# Patient Record
Sex: Female | Born: 1990 | Race: Black or African American | Hispanic: No | Marital: Single | Smoking: Current some day smoker
Health system: Southern US, Community
[De-identification: ages and names within clinical notes are randomized; demographics above are authoritative.]

## PROBLEM LIST (undated history)

## (undated) DIAGNOSIS — I10 Essential (primary) hypertension: Secondary | ICD-10-CM

---

## 2014-02-03 ENCOUNTER — Emergency Department (HOSPITAL_COMMUNITY): Payer: BC Managed Care – PPO

## 2014-02-03 ENCOUNTER — Encounter (HOSPITAL_COMMUNITY): Payer: Self-pay | Admitting: Emergency Medicine

## 2014-02-03 ENCOUNTER — Emergency Department (HOSPITAL_COMMUNITY)
Admission: EM | Admit: 2014-02-03 | Discharge: 2014-02-03 | Disposition: A | Discharge status: Home / Self Care | Payer: BC Managed Care – PPO | Attending: Emergency Medicine | Admitting: Emergency Medicine

## 2014-02-03 DIAGNOSIS — R0781 Pleurodynia: Secondary | ICD-10-CM

## 2014-02-03 DIAGNOSIS — R071 Chest pain on breathing: Secondary | ICD-10-CM | POA: Insufficient documentation

## 2014-02-03 LAB — I-STAT CHEM 8, ED
BUN: 14 mg/dL (ref 6–23)
CHLORIDE: 107 meq/L (ref 96–112)
CREATININE: 0.9 mg/dL (ref 0.50–1.10)
Calcium, Ion: 1.19 mmol/L (ref 1.12–1.23)
Glucose, Bld: 87 mg/dL (ref 70–99)
HCT: 40 % (ref 36.0–46.0)
Hemoglobin: 13.6 g/dL (ref 12.0–15.0)
Potassium: 3.9 mEq/L (ref 3.7–5.3)
SODIUM: 143 meq/L (ref 137–147)
TCO2: 21 mmol/L (ref 0–100)

## 2014-02-03 LAB — D-DIMER, QUANTITATIVE: D-Dimer, Quant: 1.18 ug/mL-FEU — ABNORMAL HIGH (ref 0.00–0.48)

## 2014-02-03 MED ORDER — IOHEXOL 350 MG/ML SOLN
100.0000 mL | Freq: Once | INTRAVENOUS | Status: AC | PRN
  Administered 2014-02-03: 100 mL via INTRAVENOUS

## 2014-02-03 MED ORDER — INDOMETHACIN 25 MG PO CAPS: 25.0000 mg | ORAL_CAPSULE | Freq: Three times a day (TID) | ORAL | Status: DC | PRN

## 2014-02-03 NOTE — Discharge Instructions (Signed)

## 2014-02-03 NOTE — ED Provider Notes (Signed)
CSN: [id number]     Arrival date & time 02/03/14  1430 History   First MD Initiated Contact with Patient 02/03/14 1700     Chief Complaint  Patient presents with  . Chest Pain     (Consider location/radiation/quality/duration/timing/severity/associated sxs/prior Treatment) Patient is a 23 y.o. female presenting with chest pain. The history is provided by the patient. No language interpreter was used.  Chest Pain Pain location:  Substernal area Pain quality: sharp   Pain radiates to:  Upper back Pain radiates to the back: yes   Pain severity:  Moderate Onset quality:  Sudden Duration:  10 hours Timing:  Intermittent Progression:  Unchanged Chronicity:  New Context: breathing   Associated symptoms: no cough, no fever, no lower extremity edema, no palpitations and no shortness of breath   Risk factors: birth control   Patient started on nuvaring for birth control 2 weeks ago.  This morning awoken from sleep with sharp, substernal to right sided upper chest wall pain that is worse with breathing.  Non-smoker.  History reviewed. No pertinent past medical history. No past surgical history on file. No family history on file. History  Substance Use Topics  . Smoking status: Not on file  . Smokeless tobacco: Not on file  . Alcohol Use: Not on file   OB History   Grav Para Term Preterm Abortions TAB SAB Ect Mult Living                 Review of Systems  Constitutional: Negative for fever.  Respiratory: Negative for cough and shortness of breath.   Cardiovascular: Positive for chest pain. Negative for palpitations and leg swelling.  All other systems reviewed and are negative.     Allergies  Review of patient's allergies indicates no known allergies.  Home Medications  No current outpatient prescriptions on file. There were no vitals taken for this visit. Physical Exam  Nursing note and vitals reviewed. Constitutional: She is oriented to person, place, and time. She  appears well-developed and well-nourished. No distress.  HENT:  Head: Normocephalic and atraumatic.  Eyes: Pupils are equal, round, and reactive to light.  Neck: Normal range of motion.  Cardiovascular: Normal rate and regular rhythm.   Pulmonary/Chest: Effort normal and breath sounds normal. She has no wheezes. She exhibits no tenderness.  Abdominal: Soft. Bowel sounds are normal.  Musculoskeletal: She exhibits no edema and no tenderness.  Lymphadenopathy:    She has no cervical adenopathy.  Neurological: She is alert and oriented to person, place, and time.  Skin: Skin is warm and dry.  Psychiatric: She has a normal mood and affect. Her behavior is normal. Judgment and thought content normal.    ED Course  Procedures (including critical care time) Labs Review Labs Reviewed - No data to display Imaging Review Dg Chest 2 View  02/03/2014   CLINICAL DATA:  Chest pain, shortness of Breath  EXAM: CHEST  2 VIEW  COMPARISON:  None.  FINDINGS: Cardiomediastinal silhouette is unremarkable. No acute infiltrate or pleural effusion. No pulmonary edema. Bony thorax is unremarkable.  IMPRESSION: No active cardiopulmonary disease.   Electronically Signed   By: Natasha MeadLiviu  Pop M.D.   On: 02/03/2014 15:31     EKG Interpretation None    Radiology and lab results reviewed, shared with patient. D-dimer elevated, but CTA chest negative for PE. MDM   Final diagnoses:  None    Pleuritic chest pain.    Jimmye Normanavid John Sevanna Ballengee, NP 02/04/14 [phone]

## 2014-02-03 NOTE — ED Notes (Signed)
Pt in c/o central chest pain with shortness of breath that woke her from sleep this am, states pain has been constant since it started and radiates into her back, no distress noted at this time

## 2014-02-04 NOTE — ED Provider Notes (Signed)
Medical screening examination/treatment/procedure(s) were performed by non-physician practitioner and as supervising physician I was immediately available for consultation/collaboration.   EKG Interpretation   Date/Time:  Thursday February 03 2014 14:37:28 EDT Ventricular Rate:  77 PR Interval:  126 QRS Duration: 90 QT Interval:  370 QTC Calculation: 418 R Axis:   90 Text Interpretation:  Normal sinus rhythm with sinus arrhythmia Rightward  axis Nonspecific T wave abnormality Abnormal ECG No previous ECGs  available Confirmed by Manus GunningANCOUR  MD, Shirlena Brinegar [phone]([id number]) on 02/03/2014 5:27:16 PM        Glynn OctaveStephen Denetta Fei, MD 02/04/14 [phone]

## 2014-07-30 ENCOUNTER — Emergency Department (HOSPITAL_COMMUNITY)
Admission: EM | Admit: 2014-07-30 | Discharge: 2014-07-30 | Disposition: A | Discharge status: Home / Self Care | Payer: No Typology Code available for payment source | Attending: Emergency Medicine | Admitting: Emergency Medicine

## 2014-07-30 ENCOUNTER — Emergency Department (HOSPITAL_COMMUNITY): Payer: No Typology Code available for payment source

## 2014-07-30 ENCOUNTER — Encounter (HOSPITAL_COMMUNITY): Payer: Self-pay | Admitting: Emergency Medicine

## 2014-07-30 DIAGNOSIS — S199XXA Unspecified injury of neck, initial encounter: Secondary | ICD-10-CM | POA: Diagnosis present

## 2014-07-30 DIAGNOSIS — S139XXA Sprain of joints and ligaments of unspecified parts of neck, initial encounter: Secondary | ICD-10-CM | POA: Insufficient documentation

## 2014-07-30 DIAGNOSIS — S3982XA Other specified injuries of lower back, initial encounter: Secondary | ICD-10-CM | POA: Diagnosis not present

## 2014-07-30 DIAGNOSIS — Y92410 Unspecified street and highway as the place of occurrence of the external cause: Secondary | ICD-10-CM | POA: Insufficient documentation

## 2014-07-30 DIAGNOSIS — S161XXA Strain of muscle, fascia and tendon at neck level, initial encounter: Secondary | ICD-10-CM

## 2014-07-30 DIAGNOSIS — Y9389 Activity, other specified: Secondary | ICD-10-CM | POA: Insufficient documentation

## 2014-07-30 MED ORDER — HYDROCODONE-ACETAMINOPHEN 5-325 MG PO TABS
1.0000 | ORAL_TABLET | Freq: Once | ORAL | Status: AC
  Administered 2014-07-30: 1 via ORAL
  Filled 2014-07-30: qty 1

## 2014-07-30 MED ORDER — CYCLOBENZAPRINE HCL 10 MG PO TABS
10.0000 mg | ORAL_TABLET | Freq: Two times a day (BID) | ORAL | Status: DC | PRN
Start: 2014-07-30 — End: 2014-09-30

## 2014-07-30 NOTE — Discharge Instructions (Signed)
Please follow the directions provided. Be sure to follow-up with your primary care doctor. I have provided resources in case you do not have one so you can establish care.  You may take ibuprofen 400 mg by mouth every 6 hours for pain and you may take your muscle relaxant as directed also.  Feel free to return for any worsening or concerning symptoms.  SEEK IMMEDIATE MEDICAL CARE IF:  You develop any bleeding.  You develop stomach upset.  You have signs of an allergic reaction to your medicine.  Your symptoms get worse.  You develop new, unexplained symptoms.  You have numbness, tingling, weakness, or paralysis in any part of your body.    Emergency Department Resource Guide 1) Find a Doctor and Pay Out of Pocket Although you won't have to find out who is covered by your insurance plan, it is a good idea to ask around and get recommendations. You will then need to call the office and see if the doctor you have chosen will accept you as a new patient and what types of options they offer for patients who are self-pay. Some doctors offer discounts or will set up payment plans for their patients who do not have insurance, but you will need to ask so you aren't surprised when you get to your appointment.  2) Contact Your Local Health Department Not all health departments have doctors that can see patients for sick visits, but many do, so it is worth a call to see if yours does. If you don't know where your local health department is, you can check in your phone book. The CDC also has a tool to help you locate your state's health department, and many state websites also have listings of all of their local health departments.  3) Find a Walk-in Clinic If your illness is not likely to be very severe or complicated, you may want to try a walk in clinic. These are popping up all over the country in pharmacies, drugstores, and shopping centers. They're usually staffed by nurse practitioners or physician  assistants that have been trained to treat common illnesses and complaints. They're usually fairly quick and inexpensive. However, if you have serious medical issues or chronic medical problems, these are probably not your best option.  No Primary Care Doctor: - Call Health Connect at  [phone] - they can help you locate a primary care doctor that  accepts your insurance, provides certain services, etc. - Physician Referral Service- [phone]  Chronic Pain Problems: Organization         Address  Phone   Notes  [facility]  [phone] Patients need to be referred by their primary care doctor.   Medication Assistance: Organization         Address  Phone   Notes  [facility] [address] [place] [place], [place] [address] [phone] --Must be a resident of [facility] -- Must have NO insurance coverage whatsoever (no Medicaid/ Medicare, etc.) -- The pt. MUST have a primary care doctor that directs their care regularly and follows them in the community   [facility]ist  [phone]   [employer]  [phone]    Agencies that provide inexpensive medical care: Organization         Address  Phone   Notes  [facility]  [phone]   [facility]    [phone]   [facility] [address] [place], [place] [address] [phone]) [phone]   [facility] [place] [address], [place] [phone](336) [phone]   [facility]anned Parenthood    [phone]) [phone]   [facility]    [phone]   [facility] Health and [facility]  [address], [place] Phone:  [phone](336) [phone], Fax:  [phone] Hours of Operation:  9 am - 6 pm, M-F.  Also accepts Medicaid/Medicare and self-pay.  [facility]  [address], [place] Phone: [phone](336) [phone], Fax: [phone](336) [phone]. Hours of Operation:  8:30 am - 5:30 pm, M-F.  Also accepts  Medicaid and self-pay.  [facility] [place] [address], [place] Phone: [phone](336) [phone]   [facility] [address], [place], [place] [phone], Ext. [phone] Mondays & Thursdays: 7-9 AM.  First 15 patients are seen on a first come, first serve basis.    Medicaid-accepting [facility] Providers:  Organization         Address  Phone   Notes  [facility] [address], [place] [phone](336) [phone] Also accepts self-pay patients.  [facility] [address], [place]  [phone]) [phone]   [facility] [address], [place] [phone]) [phone]   [facility] [address] [place] [address], [place] [phone]) [phone]   Renaye RakersVeita Bland [address], [place]   [phone](336) [phone] Only accepts [place] Access [place] patients after they have their name applied to their card.   Self-Pay (no insurance) in [facility]:  Organization         Address  Phone   Notes  Sickle Cell Patients, [facility] [address], [place] [phone](336) [phone]   [facility] [address], [place] [phone]) [phone]   [facility]  [address], [place] [phone](336) [phone]   [facility]/Dr. Osei-Bonsu  [address] [place] [address], [place] or [id number], [place] [phone](336) [phone] Phone number for both [place] and [place] locations is the same.  [facility] [address], [place] [phone]) [phone]   [facility]  [address] [place] [address], [place] or [address] [phone]) [phone]   [facility] [address], [place] [phone](336) [phone], phone; [phone](336) [phone], fax Sees patients 1st and 3rd Saturday of every month.  Must not qualify for public or private insurance (i.e. Medicaid, Medicare, [place] Health Choice, Veterans' Benefits)  Household  income should be no more than 200% of the poverty level The clinic cannot treat you if you are pregnant or think you are pregnant  Sexually transmitted diseases are not treated at the clinic.    Dental Care: Organization         Address  Phone  Notes  [facility] Department of [facility] [address], [place] [phone]) [phone] Accepts children up to age 22 who are enrolled in [place]icaid or [place] Health Choice; pregnant women with a Medicaid card; and children who have applied for Medicaid or [place] Health Choice, but were declined, whose parents can pay a reduced fee at time of service.  [facility] of [facility] [place]  [address], [place] [phone]) [phone] Accepts children up to age 22 who are enrolled in  Medicaid or [place] Health Choice; pregnant women with a Medicaid card; and children who have applied for Medicaid or [place] Health Choice, but were declined, whose parents can pay a reduced fee at time of service.  [facility]  [address], [place] [phone]) [phone] Patients are seen by appointment only. Walk-ins are not accepted. [facility] will see patients 22 years of age and older. Monday - Tuesday (8am-5pm) Most Wednesdays (8:30-5pm) $30 per visit, cash only  [facility]  [address], [facility] [phone] Patients are seen by appointment only. Walk-ins are not accepted. [facility] will see patients 22 years of age and older. One Wednesday Evening (Monthly: Volunteer Based).  $30 per visit, cash only  [facility]  [phone] for adults; Children under age 734, call Graduate Pediatric Dentistry at [phone]. Children aged 884-14, please call [phone] to request a pediatric application.  Dental services are provided in all areas of dental care including fillings, crowns and bridges, complete and partial dentures, implants, gum  treatment, root canals, and extractions. Preventive care is also provided. Treatment is provided to both adults and children. Patients are selected via a lottery and there is often a waiting list.   [facility] [address], [place]  [phone]) [phone] [email or web address]   [facility] [address], [place], [place] [phone], Ext. [phone] Second and Fourth Thursday of each month, opens at 6:30 AM; Clinic ends at 9 AM.  Patients are seen on a first-come first-served basis, and a limited number are seen during each clinic.   [facility]  [address], [place], [place] [phone]) [phone]   Eligibility Requirements You must have lived in [place], [place], or [place] counties for at least the last three months.   You cannot be eligible for state or federal sponsored National Cityhealthcare insurance, including CIGNAVeterans Administration, [place]icaid, or [employer] EntertainmentMedicare.   You generally cannot be eligible for healthcare insurance through your employer.    How to apply: Eligibility screenings are held every Tuesday and Wednesday afternoon from 1:00 pm until 4:00 pm. You do not need an appointment for the interview!  [facility] [address], [place], [place] [phone]   [facility] Health Department  [phone]   [facility]  [phone]   [facility]  [phone]    Behavioral Health Resources in the Community: Intensive Outpatient Programs Organization         Address  Phone  Notes  [facility] [address], [place], [place] [phone]   [facility] [address], [place], [place] [phone]   [facility]: [facility] Drug S[facility] [address], [place], [place]  [phone]   [facility] [address],  [place], [place] [phone] or [phone]   Substance Abuse Resources Organization         Address  Phone  Notes  Alcohol and  Drug Services  [phone]   [facility]  [phone]   [facility]  [phone]   [facility]  [phone]   Residential & Outpatient Substance Abuse Program  [phone]   Psychological Services Organization         Address  Phone  Notes  [facility]  [phone]- [phone]   [facility]  [phone]   [facility] [address], [place] [phone] or  [phone]    Mobile Crisis Teams Organization         Address  Phone  Notes  [facility], Mobile Crisis Care Unit  [phone]   [facility]  [address] [place], [place] [phone]   [facility] [address] [place] [phone]    Self-Help/Support Groups Organization         Address  Phone             Notes  Mental Health Assoc. of  - variety of support groups  336- [phone] Call for more information  Narcotics Anonymous (NA), [facility]aring [facility] [address], [place]  2 meetings at this location   Statisticianesidential Treatment Programs Organization         Address  Phone  Notes  [facility] [address],    [place]  [phone]   [facility]  [address], [place] [phone], [place], [place] [phone]   [facility] [address], [place] [phone] Admissions: 8am-3pm M-F  [facility] [address]    [place], [place] [phone]   [facility] [address], [place], [place] [phone]   [facility] [address]  [place], [place] [phone]   [facility] - Intensive Outpatient [address], [place], [place] [phone]   [facility] ([facility] [address]  [place], [place] [phone] or [phone]   [facility] ([facility]) [address] [place], [place] [phone] Accepts Medicaid  Fellowship  [facility] [address]  [place] [phone] Substance Abuse/Addiction Treatment   [facility] Behavioral Health Resources Organization         Address  Phone  Notes  [facility]  [phone]) [phone]   Angie FavaJulie Brannon, PhD [address], [address] [place], [place]   [phone](336) [phone] or [phone]   [facility]   [address] [place], [place] [phone](336) [phone]   [facility] [address], [place], [place] [phone](336) [phone] Insurance/Medicaid/sponsorship through [facility]  [facility] Families [address]                                    [place], [place] [phone]) [phone] Therapy/tele-psych/case  [facility] [address]   [place], [place] [phone](336) [phone]    Dr. Lolly MustacheArfeen  [phone](336) [phone]   [facility] [place]  [employer] [facility] 1) [address], [place] 2) [address], [place] 3)  [address], [place] [phone](336) [phone]([phone]  [phone](336) [phone]   [facility] Child Abuse Hotline [phone](336) [phone] or [phone](336) [phone] (After Hours)
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
## 2014-08-04 NOTE — ED Provider Notes (Signed)
Medical screening examination/treatment/procedure(s) were performed by non-physician practitioner and as supervising physician I was immediately available for consultation/collaboration.   EKG Interpretation None        Vanetta MuldersScott Khaza Blansett, MD 08/04/14 2102

## 2014-09-12 ENCOUNTER — Other Ambulatory Visit: Payer: Self-pay | Admitting: Obstetrics & Gynecology

## 2014-09-12 ENCOUNTER — Other Ambulatory Visit (HOSPITAL_COMMUNITY)
Admission: RE | Admit: 2014-09-12 | Discharge: 2014-09-12 | Disposition: A | Discharge status: Home / Self Care | Payer: BC Managed Care – PPO | Source: Ambulatory Visit | Attending: Obstetrics & Gynecology | Admitting: Obstetrics & Gynecology

## 2014-09-12 DIAGNOSIS — Z01419 Encounter for gynecological examination (general) (routine) without abnormal findings: Secondary | ICD-10-CM | POA: Insufficient documentation

## 2014-09-12 DIAGNOSIS — Z113 Encounter for screening for infections with a predominantly sexual mode of transmission: Secondary | ICD-10-CM | POA: Insufficient documentation

## 2014-09-14 LAB — CYTOLOGY - PAP

## 2014-09-29 ENCOUNTER — Encounter (HOSPITAL_COMMUNITY): Payer: Self-pay | Admitting: *Deleted

## 2014-09-29 ENCOUNTER — Emergency Department (HOSPITAL_COMMUNITY)
Admission: EM | Admit: 2014-09-29 | Discharge: 2014-09-30 | Disposition: A | Discharge status: Home / Self Care | Payer: BC Managed Care – PPO | Source: Home / Self Care | Attending: Emergency Medicine | Admitting: Emergency Medicine

## 2014-09-29 ENCOUNTER — Telehealth: Payer: No Typology Code available for payment source | Admitting: Physician Assistant

## 2014-09-29 DIAGNOSIS — M545 Low back pain: Secondary | ICD-10-CM

## 2014-09-29 DIAGNOSIS — M5442 Lumbago with sciatica, left side: Secondary | ICD-10-CM

## 2014-09-29 DIAGNOSIS — Z720 Tobacco use: Secondary | ICD-10-CM | POA: Diagnosis not present

## 2014-09-29 DIAGNOSIS — Z79899 Other long term (current) drug therapy: Secondary | ICD-10-CM

## 2014-09-29 DIAGNOSIS — Z3A01 Less than 8 weeks gestation of pregnancy: Secondary | ICD-10-CM | POA: Diagnosis not present

## 2014-09-29 DIAGNOSIS — Z3A08 8 weeks gestation of pregnancy: Secondary | ICD-10-CM

## 2014-09-29 DIAGNOSIS — N390 Urinary tract infection, site not specified: Secondary | ICD-10-CM

## 2014-09-29 DIAGNOSIS — O2341 Unspecified infection of urinary tract in pregnancy, first trimester: Secondary | ICD-10-CM

## 2014-09-29 DIAGNOSIS — O200 Threatened abortion: Secondary | ICD-10-CM | POA: Insufficient documentation

## 2014-09-29 DIAGNOSIS — O209 Hemorrhage in early pregnancy, unspecified: Secondary | ICD-10-CM | POA: Diagnosis present

## 2014-09-29 DIAGNOSIS — Z791 Long term (current) use of non-steroidal anti-inflammatories (NSAID): Secondary | ICD-10-CM

## 2014-09-29 DIAGNOSIS — O9989 Other specified diseases and conditions complicating pregnancy, childbirth and the puerperium: Secondary | ICD-10-CM | POA: Insufficient documentation

## 2014-09-29 NOTE — ED Notes (Signed)
Pt c/o intermittent mid low back pain since 9 am this morning. Pt states she is [redacted] weeks pregnant and wants to make sure everything is ok. Pt denies bleeding, discharge. No recent falls or trauma
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
## 2014-09-30 ENCOUNTER — Emergency Department (HOSPITAL_COMMUNITY): Payer: BC Managed Care – PPO

## 2014-09-30 ENCOUNTER — Encounter (HOSPITAL_COMMUNITY): Payer: Self-pay | Admitting: *Deleted

## 2014-09-30 ENCOUNTER — Emergency Department (HOSPITAL_COMMUNITY)
Admission: EM | Admit: 2014-09-30 | Discharge: 2014-10-01 | Disposition: A | Discharge status: Home / Self Care | Payer: BC Managed Care – PPO | Attending: Emergency Medicine | Admitting: Emergency Medicine

## 2014-09-30 DIAGNOSIS — O200 Threatened abortion: Secondary | ICD-10-CM

## 2014-09-30 DIAGNOSIS — N939 Abnormal uterine and vaginal bleeding, unspecified: Secondary | ICD-10-CM

## 2014-09-30 LAB — POC URINE PREG, ED: PREG TEST UR: POSITIVE — AB

## 2014-09-30 LAB — CBC
HCT: 39.1 % (ref 36.0–46.0)
HEMOGLOBIN: 13.2 g/dL (ref 12.0–15.0)
MCH: 29.3 pg (ref 26.0–34.0)
MCHC: 33.8 g/dL (ref 30.0–36.0)
MCV: 86.9 fL (ref 78.0–100.0)
Platelets: 307 10*3/uL (ref 150–400)
RBC: 4.5 MIL/uL (ref 3.87–5.11)
RDW: 13.9 % (ref 11.5–15.5)
WBC: 11 10*3/uL — AB (ref 4.0–10.5)

## 2014-09-30 LAB — URINALYSIS, ROUTINE W REFLEX MICROSCOPIC
Bilirubin Urine: NEGATIVE
GLUCOSE, UA: NEGATIVE mg/dL
Hgb urine dipstick: NEGATIVE
KETONES UR: 15 mg/dL — AB
Nitrite: POSITIVE — AB
PH: 6 (ref 5.0–8.0)
Protein, ur: NEGATIVE mg/dL
SPECIFIC GRAVITY, URINE: 1.031 — AB (ref 1.005–1.030)
Urobilinogen, UA: 1 mg/dL (ref 0.0–1.0)

## 2014-09-30 LAB — ABO/RH: ABO/RH(D): O POS

## 2014-09-30 LAB — URINE MICROSCOPIC-ADD ON

## 2014-09-30 LAB — I-STAT CHEM 8, ED
BUN: 4 mg/dL — AB (ref 6–23)
CREATININE: 0.7 mg/dL (ref 0.50–1.10)
Calcium, Ion: 1.19 mmol/L (ref 1.12–1.23)
Chloride: 102 mEq/L (ref 96–112)
Glucose, Bld: 91 mg/dL (ref 70–99)
HCT: 42 % (ref 36.0–46.0)
Hemoglobin: 14.3 g/dL (ref 12.0–15.0)
POTASSIUM: 3.4 meq/L — AB (ref 3.7–5.3)
SODIUM: 138 meq/L (ref 137–147)
TCO2: 21 mmol/L (ref 0–100)

## 2014-09-30 LAB — HCG, QUANTITATIVE, PREGNANCY: HCG, BETA CHAIN, QUANT, S: 36051 m[IU]/mL — AB (ref <5)

## 2014-09-30 MED ORDER — CYCLOBENZAPRINE HCL 10 MG PO TABS: 10.0000 mg | ORAL_TABLET | Freq: Three times a day (TID) | ORAL | Status: DC | PRN

## 2014-09-30 MED ORDER — CEPHALEXIN 500 MG PO CAPS: 500.0000 mg | ORAL_CAPSULE | Freq: Three times a day (TID) | ORAL | Status: DC

## 2014-09-30 MED ORDER — OXYCODONE-ACETAMINOPHEN 5-325 MG PO TABS
1.0000 | ORAL_TABLET | Freq: Once | ORAL | Status: AC
  Administered 2014-09-30: 1 via ORAL

## 2014-09-30 MED ORDER — CEPHALEXIN 250 MG PO CAPS
500.0000 mg | ORAL_CAPSULE | Freq: Once | ORAL | Status: AC
  Administered 2014-09-30: 500 mg via ORAL

## 2014-09-30 NOTE — Discharge Instructions (Signed)
Take Tylenol for pain as directed on the bottle. Take Keflex as prescribed until all gone for the infection. Try some heating pads, stretches. Please follow-up with your primary care doctor or OB/GYN to recheck urine in 1 week to make sure infection has resolved and for further evaluation of the back pain if it persists. Return if symptoms are worsening  Acute Urinary Retention Acute urinary retention is the temporary inability to urinate. This is an uncommon problem in women. It can be caused by:  Infection.  A side effect of a medicine.  A problem in a nearby organ that presses or squeezes on the bladder or the urethra (the tube that drains the bladder).  Psychological problems.   Surgery on your bladder, urethra, or pelvic organs that causes obstruction to the outflow of urine from your bladder. HOME CARE INSTRUCTIONS  If you are sent home with a Foley catheter and a drainage system, you will need to discuss the best course of action with your health care provider. While the catheter is in, maintain a good intake of fluids. Keep the drainage bag emptied and lower than your catheter. This is so that contaminated urine will not flow back into your bladder, which could lead to a urinary tract infection. There are two main types of drainage bags. One is a large bag that usually is used at night. It has a good capacity that will allow you to sleep through the night without having to empty it. The second type is called a leg bag. It has a smaller capacity so it needs to be emptied more frequently. However, the main advantage is that it can be attached by a leg strap and goes underneath your clothing, allowing you the freedom to move about or leave your home. Only take over-the-counter or prescription medicines for pain, discomfort, or fever as directed by your health care provider.  SEEK MEDICAL CARE IF:  You develop a low-grade fever.  You experience spasms or leakage of urine with the  spasms. SEEK IMMEDIATE MEDICAL CARE IF:   You develop chills or fever.  Your catheter stops draining urine.  Your catheter falls out.  You start to develop increased bleeding that does not respond to rest and increased fluid intake. MAKE SURE YOU:  Understand these instructions.  Will watch your condition.  Will get help right away if you are not doing well or get worse. Document Released: 10/13/2006 Document Revised: 08/04/2013 Document Reviewed: 03/25/2013 [facility] Patient Information 2015 [employer], [place]. This information is not intended to replace advice given to you by your health care provider. Make sure you discuss any questions you have with your health care provider.

## 2014-09-30 NOTE — ED Notes (Signed)
Pt reports being seen here last night for lower back pains, pt is approx [redacted] weeks pregnant. Reports onset today of vaginal bleeding/spotting with mild cramps. No acute distress noted at triage.

## 2014-09-30 NOTE — ED Notes (Signed)
Rec'd in room 24, family accompanying.  Pt reports she is [redacted] weeks pregnant experiencing vaginal bleeding.  Dr Hyacinth MeekerMiller into room with portable US machine.  Able to see area in uterus where embryo should be.

## 2014-09-30 NOTE — ED Notes (Signed)
Pt to US with EDT Foye ClockKristina accompanying.

## 2014-09-30 NOTE — Progress Notes (Signed)
We are sorry that you are not feeling well.  Here is how we plan to help!  Based on what you have shared with me it looks like you mostly have acute back pain.  Acute back pain is defined as musculoskeletal pain that can resolve in 1-3 weeks with conservative treatment.  I have prescribedFlexeril 10 mg every eight hours as needed which is a muscle relaxer.  AS you are pregnant, prescription pain medications can be dangerous to the baby.  The flexeril is safe. Also consider Tylenol for the pain itself as this is also safe for baby. No heavy lifting.  Please keep in mind that muscle relaxer's can cause fatigue and should not be taken while at work or driving.  Back pain is very common.  The pain often gets better over time.  The cause of back pain is usually not dangerous.  Most people can learn to manage their back pain on their own.  Home Care  Stay active.  Start with short walks on flat ground if you can.  Try to walk farther each day.  Do not sit, drive or stand in one place for more than 30 minutes.  Do not stay in bed.  Do not avoid exercise or work.  Activity can help your back heal faster.  Be careful when you bend or lift an object.  Bend at your knees, keep the object close to you, and do not twist.  Sleep on a firm mattress.  Lie on your side, and bend your knees.  If you lie on your back, put a pillow under your knees.  Only take medicines as told by your doctor.  Put ice on the injured area.  Put ice in a plastic bag  Place a towel between your skin and the bag  Leave the ice on for 15-20 minutes, 3-4 times a day for the first 2-3 days.  After that, you can switch between ice and heat packs.  Ask your doctor about back exercises or massage.  Avoid feeling anxious or stressed.  Find good ways to deal with stress, such as exercise.  Get Help Right Way If:  Your pain does not go away with rest or medicine.  Your pain does not go away in 1 week.  You have new  problems.  You do not feel well.  The pain spreads into your legs.  You cannot control when you poop (bowel movement) or pee (urinate)  You feel sick to your stomach (nauseous) or throw up (vomit)  You have belly (abdominal) pain.  You feel like you may pass out (faint).  If you develop a fever.  Make Sure you:  Understand these instructions.  Will watch your condition  Will get help right away if you are not doing well or get worse.  Your e-visit answers were reviewed by a board certified advanced clinical practitioner to complete your personal care plan.  Depending on the condition, your plan could have included both over the counter or prescription medications.  Please review your pharmacy choice.  If there is a problem, you may call our nursing hot line at [phone] and have the prescription routed to another pharmacy.  Your safety is important to us.  If you have drug allergies check your prescription carefully.    You can use MyChart to ask questions about today's visit, request a non-urgent call back, or ask for a work or school excuse.  You will get an e-mail in the next two  days asking about your experience.  I hope that your e-visit has been valuable and will speed your recovery. Thank you for using e-visits.

## 2014-09-30 NOTE — Discharge Instructions (Signed)
Tylenol only for pain - your Ultrasound shows that you have a pregnancy at 6 weeks and 3 days - it is in the correct location - you should follow up very closely with your OBGYN if you continue to have bleeding or increased pain.  Please read the attached instructions.

## 2014-09-30 NOTE — ED Provider Notes (Signed)
CSN: [id number]     Arrival date & time 09/30/14  1823 History   First MD Initiated Contact with Patient 09/30/14 2042     Chief Complaint  Patient presents with  . Vaginal Bleeding     (Consider location/radiation/quality/duration/timing/severity/associated sxs/prior Treatment) HPI Comments: The patient is a 23 year old female, she is G1 P0 who believes that she is between 6 and [redacted] weeks pregnant based on last menstrual period. She has been seen at her doctor's office to have a urine pregnancy confirmed. Yesterday which she was seen here with lower back pain and had a urinary tract infection for which Keflex was started. Earlier today the patient started to have bleeding per vagina, she had a large amount of bleeding, this has gradually tapered off and now it is just a light amount of bleeding. She has no abdominal pain, no nausea vomiting fevers or chills. She denies any vaginal discharge. She had her first pelvic exam a couple of weeks ago when she was seen for her pregnancy.  Patient is a 23 y.o. female presenting with vaginal bleeding. The history is provided by the patient.  Vaginal Bleeding   History reviewed. No pertinent past medical history. History reviewed. No pertinent past surgical history. History reviewed. No pertinent family history. History  Substance Use Topics  . Smoking status: Never Smoker   . Smokeless tobacco: Not on file  . Alcohol Use: Yes   OB History    Gravida Para Term Preterm AB TAB SAB Ectopic Multiple Living   1              Review of Systems  Genitourinary: Positive for vaginal bleeding.  All other systems reviewed and are negative.     Allergies  Review of patient's allergies indicates no known allergies.  Home Medications   Prior to Admission medications   Medication Sig Start Date End Date Taking? Authorizing Provider  cephALEXin (KEFLEX) 500 MG capsule Take 1 capsule (500 mg total) by mouth 3 (three) times daily. 09/30/14  Yes Tatyana A  Kirichenko, PA-C  cyclobenzaprine (FLEXERIL) 10 MG tablet Take 1 tablet (10 mg total) by mouth 3 (three) times daily as needed for muscle spasms. Patient not taking: Reported on 09/30/2014 09/30/14   Waldon MerlWilliam C Martin, PA-C   BP 137/83 mmHg  Pulse 71  Temp(Src) 98.1 F (36.7 C) (Oral)  Resp 18  SpO2 100%  LMP 07/06/2014 Physical Exam  Constitutional: She appears well-developed and well-nourished. No distress.  HENT:  Head: Normocephalic and atraumatic.  Mouth/Throat: Oropharynx is clear and moist. No oropharyngeal exudate.  Eyes: Conjunctivae and EOM are normal. Pupils are equal, round, and reactive to light. Right eye exhibits no discharge. Left eye exhibits no discharge. No scleral icterus.  Neck: Normal range of motion. Neck supple. No JVD present. No thyromegaly present.  Cardiovascular: Normal rate, regular rhythm, normal heart sounds and intact distal pulses.  Exam reveals no gallop and no friction rub.   No murmur heard. Pulmonary/Chest: Effort normal and breath sounds normal. No respiratory distress. She has no wheezes. She has no rales.  Abdominal: Soft. Bowel sounds are normal. She exhibits no distension and no mass. There is no tenderness.  Genitourinary:  Chaperone present for vaginal exam, normal appearing external genitalia, moderate amount of dark blood in the vaginal vault, no products of conception, cervical os is closed.  Musculoskeletal: Normal range of motion. She exhibits no edema or tenderness.  Lymphadenopathy:    She has no cervical adenopathy.  Neurological: She is  alert. Coordination normal.  Skin: Skin is warm and dry. No rash noted. No erythema.  Psychiatric: She has a normal mood and affect. Her behavior is normal.  Nursing note and vitals reviewed.   ED Course  Procedures (including critical care time) Labs Review Labs Reviewed  HCG, QUANTITATIVE, PREGNANCY - Abnormal; Notable for the following:    hCG, Beta Chain, Quant, S [id number] (*)    All other  components within normal limits  CBC - Abnormal; Notable for the following:    WBC 11.0 (*)    All other components within normal limits  I-STAT CHEM 8, ED - Abnormal; Notable for the following:    Potassium 3.4 (*)    BUN 4 (*)    All other components within normal limits  ABO/RH    Imaging Review [place] Ob Comp Less 14 Wks  09/30/2014   CLINICAL DATA:  Pregnant, vaginal bleeding  EXAM: OBSTETRIC <14 WK US AND TRANSVAGINAL OB US  TECHNIQUE: Both transabdominal and transvaginal ultrasound examinations were performed for complete evaluation of the gestation as well as the maternal uterus, adnexal regions, and pelvic cul-de-sac. Transvaginal technique was performed to assess early pregnancy.  COMPARISON:  None.  FINDINGS: Intrauterine gestational sac: Visualized/normal in shape.  Yolk sac:  Present  Embryo:  Present  Cardiac Activity: Present  Heart Rate:  131 bpm  CRL:   5.8  mm   6 w 3 d                  US EDC: 05/23/2015  Maternal uterus/adnexae: Small subchorionic hemorrhage.  Bilateral ovaries are not discretely visualized.  No free fluid.  IMPRESSION: Single live intrauterine gestation with estimated gestational age [redacted] weeks 3 days by crown-rump length.   Electronically Signed   By: Charline BillsSriyesh  Krishnan M.D.   On: 09/30/2014 22:32   [place] Ob Transvaginal  09/30/2014   CLINICAL DATA:  Pregnant, vaginal bleeding  EXAM: OBSTETRIC <14 WK US AND TRANSVAGINAL OB US  TECHNIQUE: Both transabdominal and transvaginal ultrasound examinations were performed for complete evaluation of the gestation as well as the maternal uterus, adnexal regions, and pelvic cul-de-sac. Transvaginal technique was performed to assess early pregnancy.  COMPARISON:  None.  FINDINGS: Intrauterine gestational sac: Visualized/normal in shape.  Yolk sac:  Present  Embryo:  Present  Cardiac Activity: Present  Heart Rate:  131 bpm  CRL:   5.8  mm   6 w 3 d                  US EDC: 05/23/2015  Maternal uterus/adnexae: Small subchorionic  hemorrhage.  Bilateral ovaries are not discretely visualized.  No free fluid.  IMPRESSION: Single live intrauterine gestation with estimated gestational age [redacted] weeks 3 days by crown-rump length.   Electronically Signed   By: Charline BillsSriyesh  Krishnan M.D.   On: 09/30/2014 22:32      MDM   Final diagnoses:  Vaginal bleeding  Threatened miscarriage    The patient had no tenderness on internal exam, she has a moderate amount of bleeding, this is likely a threatened abortion, will obtain official ultrasound as my bedside transabdominal ultrasound shows a pregnancy but I am not confident that is intrauterine. Vital signs normal. Check Quant.  US shows [id number] preg, IUP - RH+, pt stable for d/c, explained results, no other emergency conditions exist at this time.   Vida RollerBrian D Amberlie Gaillard, MD 09/30/14 [phone]

## 2014-10-02 LAB — URINE CULTURE: Colony Count: >=100000

## 2014-10-04 ENCOUNTER — Telehealth (HOSPITAL_COMMUNITY): Payer: Self-pay

## 2014-10-04 NOTE — Telephone Encounter (Signed)
Post ED Visit - Positive Culture Follow-up  Culture report reviewed by antimicrobial stewardship pharmacist: []  Wes Dulaney, Pharm.D., BCPS []  Celedonio MiyamotoJeremy Frens, Pharm.D., BCPS []  Georgina PillionElizabeth Martin, [address] BCPS []  BriarcliffMinh Pham, [address]Ph[address]D., BCPS, AAHIVP []  Estella HuskMichelle Turner, Pharm.D., BCPS, AAHIVP []  Babs BertinHaley Baird, Pharm.D.  Positive Urine culture, >/= 100,000 colonies -> E Coli Treated with Cephalexin, organism sensitive to the same and no further patient follow-up is required at this time.   Jean RightClark, Jean Hogan 10/04/2014, 5:26 AM

## 2014-12-18 ENCOUNTER — Emergency Department (HOSPITAL_COMMUNITY)
Admission: EM | Admit: 2014-12-18 | Discharge: 2014-12-18 | Disposition: A | Discharge status: Home / Self Care | Payer: BLUE CROSS/BLUE SHIELD | Attending: Emergency Medicine | Admitting: Emergency Medicine

## 2014-12-18 ENCOUNTER — Encounter (HOSPITAL_COMMUNITY): Payer: Self-pay | Admitting: Emergency Medicine

## 2014-12-18 DIAGNOSIS — E860 Dehydration: Secondary | ICD-10-CM | POA: Insufficient documentation

## 2014-12-18 DIAGNOSIS — Z79899 Other long term (current) drug therapy: Secondary | ICD-10-CM | POA: Insufficient documentation

## 2014-12-18 DIAGNOSIS — R109 Unspecified abdominal pain: Secondary | ICD-10-CM | POA: Diagnosis not present

## 2014-12-18 DIAGNOSIS — O99282 Endocrine, nutritional and metabolic diseases complicating pregnancy, second trimester: Secondary | ICD-10-CM | POA: Insufficient documentation

## 2014-12-18 DIAGNOSIS — Z792 Long term (current) use of antibiotics: Secondary | ICD-10-CM | POA: Insufficient documentation

## 2014-12-18 DIAGNOSIS — Z3A17 17 weeks gestation of pregnancy: Secondary | ICD-10-CM | POA: Diagnosis not present

## 2014-12-18 DIAGNOSIS — O9989 Other specified diseases and conditions complicating pregnancy, childbirth and the puerperium: Secondary | ICD-10-CM | POA: Insufficient documentation

## 2014-12-18 DIAGNOSIS — M549 Dorsalgia, unspecified: Secondary | ICD-10-CM | POA: Diagnosis not present

## 2014-12-18 DIAGNOSIS — O26892 Other specified pregnancy related conditions, second trimester: Secondary | ICD-10-CM

## 2014-12-18 LAB — URINALYSIS, ROUTINE W REFLEX MICROSCOPIC
Bilirubin Urine: NEGATIVE
Glucose, UA: NEGATIVE mg/dL
Hgb urine dipstick: NEGATIVE
KETONES UR: NEGATIVE mg/dL
Leukocytes, UA: NEGATIVE
Nitrite: NEGATIVE
Protein, ur: NEGATIVE mg/dL
Specific Gravity, Urine: 1.021 (ref 1.005–1.030)
UROBILINOGEN UA: 0.2 mg/dL (ref 0.0–1.0)
pH: 5.5 (ref 5.0–8.0)

## 2014-12-18 MED ORDER — SODIUM CHLORIDE 0.9 % IV BOLUS (SEPSIS)
1000.0000 mL | Freq: Once | INTRAVENOUS | Status: AC
  Administered 2014-12-18: 1000 mL via INTRAVENOUS

## 2014-12-18 MED ORDER — ACETAMINOPHEN ER 650 MG PO TBCR: 650.0000 mg | EXTENDED_RELEASE_TABLET | Freq: Three times a day (TID) | ORAL | Status: DC | PRN

## 2014-12-18 MED ORDER — ACETAMINOPHEN 325 MG PO TABS
650.0000 mg | ORAL_TABLET | Freq: Once | ORAL | Status: AC
Start: 2014-12-18 — End: 2014-12-18
  Administered 2014-12-18: 650 mg via ORAL
  Filled 2014-12-18: qty 2

## 2014-12-18 NOTE — ED Notes (Signed)
Pt arrives from home with c/o abdominal cramping starting tonight, states she thinks she over did it at work today. States she's [redacted] weeks pregnant. G1P0, some bleeding p/t today with this pregnancy. States it's like period cramps. Denies bleeding.

## 2014-12-18 NOTE — Discharge Instructions (Signed)
YOU WERE LIKELY HAVING CRAMPING FROM DEHYDRATION - AS THE CRAMPING RESOLVED WITH FLUIDS HERE. Back pain likely from pregnancy, take tylenol as needed. Hydrate well.  Return if there is increasing pain, bleeding.   Abdominal Pain During Pregnancy Abdominal pain is common in pregnancy. Most of the time, it does not cause harm. There are many causes of abdominal pain. Some causes are more serious than others. Some of the causes of abdominal pain in pregnancy are easily diagnosed. Occasionally, the diagnosis takes time to understand. Other times, the cause is not determined. Abdominal pain can be a sign that something is very wrong with the pregnancy, or the pain may have nothing to do with the pregnancy at all. For this reason, always tell your health care provider if you have any abdominal discomfort. HOME CARE INSTRUCTIONS  Monitor your abdominal pain for any changes. The following actions may help to alleviate any discomfort you are experiencing:  Do not have sexual intercourse or put anything in your vagina until your symptoms go away completely.  Get plenty of rest until your pain improves.  Drink clear fluids if you feel nauseous. Avoid solid food as long as you are uncomfortable or nauseous.  Only take over-the-counter or prescription medicine as directed by your health care provider.  Keep all follow-up appointments with your health care provider. SEEK IMMEDIATE MEDICAL CARE IF:  You are bleeding, leaking fluid, or passing tissue from the vagina.  You have increasing pain or cramping.  You have persistent vomiting.  You have painful or bloody urination.  You have a fever.  You notice a decrease in your baby's movements.  You have extreme weakness or feel faint.  You have shortness of breath, with or without abdominal pain.  You develop a severe headache with abdominal pain.  You have abnormal vaginal discharge with abdominal pain.  You have persistent diarrhea.  You  have abdominal pain that continues even after rest, or gets worse. MAKE SURE YOU:   Understand these instructions.  Will watch your condition.  Will get help right away if you are not doing well or get worse. Document Released: 10/14/2005 Document Revised: 08/04/2013 Document Reviewed: 05/13/2013 [facility] Patient Information 2015 [employer], [place]. This information is not intended to replace advice given to you by your health care provider. Make sure you discuss any questions you have with your health care provider.  Back Pain in Pregnancy Back pain during pregnancy is common. It happens in about half of all pregnancies. It is important for you and your baby that you remain active during your pregnancy.If you feel that back pain is not allowing you to remain active or sleep well, it is time to see your caregiver. Back pain may be caused by several factors related to changes during your pregnancy.Fortunately, unless you had trouble with your back before your pregnancy, the pain is likely to get better after you deliver. Low back pain usually occurs between the fifth and seventh months of pregnancy. It can, however, happen in the first couple months. Factors that increase the risk of back problems include:   Previous back problems.  Injury to your back.  Having twins or multiple births.  A chronic cough.  Stress.  Job-related repetitive motions.  Muscle or spinal disease in the back.  Family history of back problems, ruptured (herniated) discs, or osteoporosis.  Depression, anxiety, and panic attacks. CAUSES   When you are pregnant, your body produces a hormone called relaxin. This hormonemakes the ligaments connecting the  low back and pubic bones more flexible. This flexibility allows the baby to be delivered more easily. When your ligaments are loose, your muscles need to work harder to support your back. Soreness in your back can come from tired muscles. Soreness can also come  from back tissues that are irritated since they are receiving less support.  As the baby grows, it puts pressure on the nerves and blood vessels in your pelvis. This can cause back pain.  As the baby grows and gets heavier during pregnancy, the uterus pushes the stomach muscles forward and changes your center of gravity. This makes your back muscles work harder to maintain good posture. SYMPTOMS  Lumbar pain during pregnancy Lumbar pain during pregnancy usually occurs at or above the waist in the center of the back. There may be pain and numbness that radiates into your leg or foot. This is similar to low back pain experienced by non-pregnant women. It usually increases with sitting for long periods of time, standing, or repetitive lifting. Tenderness may also be present in the muscles along your upper back. Posterior pelvic pain during pregnancy Pain in the back of the pelvis is more common than lumbar pain in pregnancy. It is a deep pain felt in your side at the waistline, or across the tailbone (sacrum), or in both places. You may have pain on one or both sides. This pain can also go into the buttocks and backs of the upper thighs. Pubic and groin pain may also be present. The pain does not quickly resolve with rest, and morning stiffness may also be present. Pelvic pain during pregnancy can be brought on by most activities. A high level of fitness before and during pregnancy may or may not prevent this problem. Labor pain is usually 1 to 2 minutes apart, lasts for about 1 minute, and involves a bearing down feeling or pressure in your pelvis. However, if you are at term with the pregnancy, constant low back pain can be the beginning of early labor, and you should be aware of this. DIAGNOSIS  X-rays of the back should not be done during the first 12 to 14 weeks of the pregnancy and only when absolutely necessary during the rest of the pregnancy. MRIs do not give off radiation and are safe during  pregnancy. MRIs also should only be done when absolutely necessary. HOME CARE INSTRUCTIONS  Exercise as directed by your caregiver. Exercise is the most effective way to prevent or manage back pain. If you have a back problem, it is especially important to avoid sports that require sudden body movements. Swimming and walking are great activities.  Do not stand in one place for long periods of time.  Do not wear high heels.  Sit in chairs with good posture. Use a pillow on your lower back if necessary. Make sure your head rests over your shoulders and is not hanging forward.  Try sleeping on your side, preferably the left side, with a pillow or two between your legs. If you are sore after a night's rest, your bedmay betoo soft.Try placing a board between your mattress and box spring.  Listen to your body when lifting.If you are experiencing pain, ask for help or try bending yourknees more so you can use your leg muscles rather than your back muscles. Squat down when picking up something from the floor. Do not bend over.  Eat a healthy diet. Try to gain weight within your caregiver's recommendations.  Use heat or cold packs  3 to 4 times a day for 15 minutes to help with the pain.  Only take over-the-counter or prescription medicines for pain, discomfort, or fever as directed by your caregiver. Sudden (acute) back pain  Use bed rest for only the most extreme, acute episodes of back pain. Prolonged bed rest over 48 hours will aggravate your condition.  Ice is very effective for acute conditions.  Put ice in a plastic bag.  Place a towel between your skin and the bag.  Leave the ice on for 10 to 20 minutes every 2 hours, or as needed.  Using heat packs for 30 minutes prior to activities is also helpful. Continued back pain See your caregiver if you have continued problems. Your caregiver can help or refer you for appropriate physical therapy. With conditioning, most back problems  can be avoided. Sometimes, a more serious issue may be the cause of back pain. You should be seen right away if new problems seem to be developing. Your caregiver may recommend:  A maternity girdle.  An elastic sling.  A back brace.  A massage therapist or acupuncture. SEEK MEDICAL CARE IF:   You are not able to do most of your daily activities, even when taking the pain medicine you were given.  You need a referral to a physical therapist or chiropractor.  You want to try acupuncture. SEEK IMMEDIATE MEDICAL CARE IF:  You develop numbness, tingling, weakness, or problems with the use of your arms or legs.  You develop severe back pain that is no longer relieved with medicines.  You have a sudden change in bowel or bladder control.  You have increasing pain in other areas of the body.  You develop shortness of breath, dizziness, or fainting.  You develop nausea, vomiting, or sweating.  You have back pain which is similar to labor pains.  You have back pain along with your water breaking or vaginal bleeding.  You have back pain or numbness that travels down your leg.  Your back pain developed after you fell.  You develop pain on one side of your back. You may have a kidney stone.  You see blood in your urine. You may have a bladder infection or kidney stone.  You have back pain with blisters. You may have shingles. Back pain is fairly common during pregnancy but should not be accepted as just part of the process. Back pain should always be treated as soon as possible. This will make your pregnancy as pleasant as possible. Document Released: 01/22/2006 Document Revised: 01/06/2012 Document Reviewed: 03/05/2011 [employer] [facility] Patient Information 2015 [place], [place]. This information is not intended to replace advice given to you by your health care provider. Make sure you discuss any questions you have with your health care provider.

## 2014-12-19 LAB — URINE CULTURE: Colony Count: 70000

## 2014-12-23 NOTE — ED Provider Notes (Signed)
CSN: [id number]     Arrival date & time 12/18/14  [id number] History   First MD Initiated Contact with Patient 12/18/14 [phone]     Chief Complaint  Patient presents with  . Abdominal Cramping     (Consider location/radiation/quality/duration/timing/severity/associated sxs/prior Treatment) HPI Comments: Pt comes in with cc of back pain and abd cramping. Pt is G1P0, she is [redacted] weeks pregnant. Reports that she has to stand all day at work, and while at work she started having the cramping pain in the abd and back pain. Pt has no n/v/f/c. No vaginal discharge, bleeding. No trauma.  Patient is a 24 y.o. female presenting with cramps. The history is provided by the patient.  Abdominal Cramping Associated symptoms include abdominal pain. Pertinent negatives include no chest pain, no headaches and no shortness of breath.    History reviewed. No pertinent past medical history. History reviewed. No pertinent past surgical history. No family history on file. History  Substance Use Topics  . Smoking status: Never Smoker   . Smokeless tobacco: Not on file  . Alcohol Use: Yes   OB History    Gravida Para Term Preterm AB TAB SAB Ectopic Multiple Living   1              Review of Systems  Constitutional: Negative for activity change.  Respiratory: Negative for shortness of breath.   Cardiovascular: Negative for chest pain.  Gastrointestinal: Positive for abdominal pain. Negative for nausea and vomiting.  Genitourinary: Negative for dysuria, flank pain and pelvic pain.  Musculoskeletal: Positive for back pain. Negative for neck pain.  Neurological: Negative for headaches.      Allergies  Review of patient's allergies indicates no known allergies.  Home Medications   Prior to Admission medications   Medication Sig Start Date End Date Taking? Authorizing Provider  nitrofurantoin, macrocrystal-monohydrate, (MACROBID) 100 MG capsule Take 100 mg by mouth 2 (two) times daily.   Yes Historical  Provider, MD  acetaminophen (TYLENOL 8 HOUR) 650 MG CR tablet Take 1 tablet (650 mg total) by mouth every 8 (eight) hours as needed for pain. 12/18/14   Derwood KaplanAnkit Jaiden Dinkins, MD  cephALEXin (KEFLEX) 500 MG capsule Take 1 capsule (500 mg total) by mouth 3 (three) times daily. Patient not taking: Reported on 12/18/2014 09/30/14   Tatyana A Kirichenko, PA-C  cyclobenzaprine (FLEXERIL) 10 MG tablet Take 1 tablet (10 mg total) by mouth 3 (three) times daily as needed for muscle spasms. Patient not taking: Reported on 09/30/2014 09/30/14   Waldon MerlWilliam C Martin, PA-C   BP 121/81 mmHg  Pulse 90  Temp(Src) 98.9 F (37.2 C) (Oral)  Resp 16  Ht 5\' 4"  (1.626 m)  Wt 181 lb (82.101 kg)  BMI 31.05 kg/m2  SpO2 100%  LMP 07/06/2014 Physical Exam  Constitutional: She is oriented to person, place, and time. She appears well-developed.  HENT:  Head: Normocephalic and atraumatic.  Eyes: Conjunctivae and EOM are normal. Pupils are equal, round, and reactive to light.  Neck: Normal range of motion. Neck supple.  Cardiovascular: Normal rate, regular rhythm and normal heart sounds.   Pulmonary/Chest: Effort normal and breath sounds normal. No respiratory distress.  Abdominal: Soft. Bowel sounds are normal. She exhibits no distension. There is no tenderness. There is no rebound and no guarding.  Neurological: She is alert and oriented to person, place, and time.  Skin: Skin is warm and dry.  Nursing note and vitals reviewed.   ED Course  Procedures (including critical care time) Labs  Review Labs Reviewed  URINE CULTURE  URINALYSIS, ROUTINE W REFLEX MICROSCOPIC    Imaging Review No results found.   EKG Interpretation None      MDM   Final diagnoses:  Dehydration  Pregnancy related back pain, antepartum, second trimester    Pt comes in with back pain and crampy abd pain. Pain doesn't appear to be labor type pain, and she has no bleeding. Fetal heart tones are reassuring. Pt was given ivf, and her  cramping resolved. Back pain still present. Tylenol given. Back pain also likely pain related. UA appears clean. Will d.c.   Derwood Kaplan, MD 12/23/14 [phone]

## 2015-08-11 IMAGING — US US OB COMP LESS 14 WK
1 series · 14 of 28 positions shown · non-contrast
Comparison: None.

CLINICAL DATA: Pregnant, vaginal bleeding

EXAM:
OBSTETRIC <14 WK US AND TRANSVAGINAL OB US
TECHNIQUE: Both transabdominal and transvaginal ultrasound examinations were
performed for complete evaluation of the gestation as well as the
maternal uterus, adnexal regions, and pelvic cul-de-sac.
Transvaginal technique was performed to assess early pregnancy.

[Series 1: us ob comp less 14 wk · 0.27mm/px · 14 of 46 slices shown]
[im 2/46]
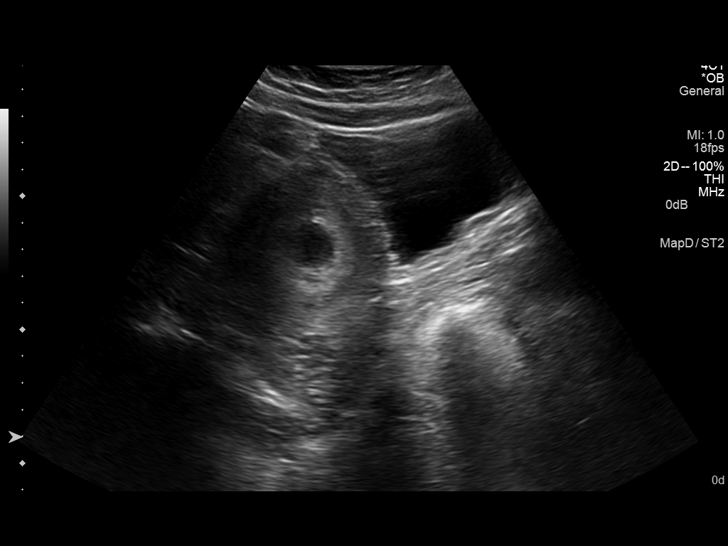
[im 6/46]
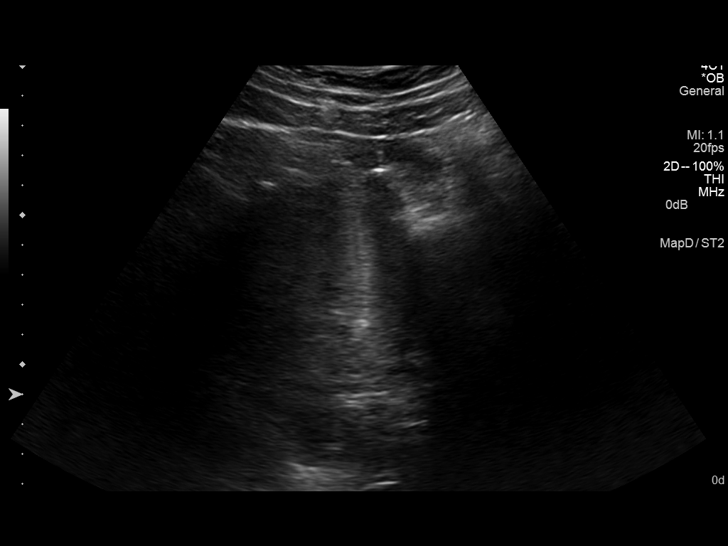
[im 9/46]
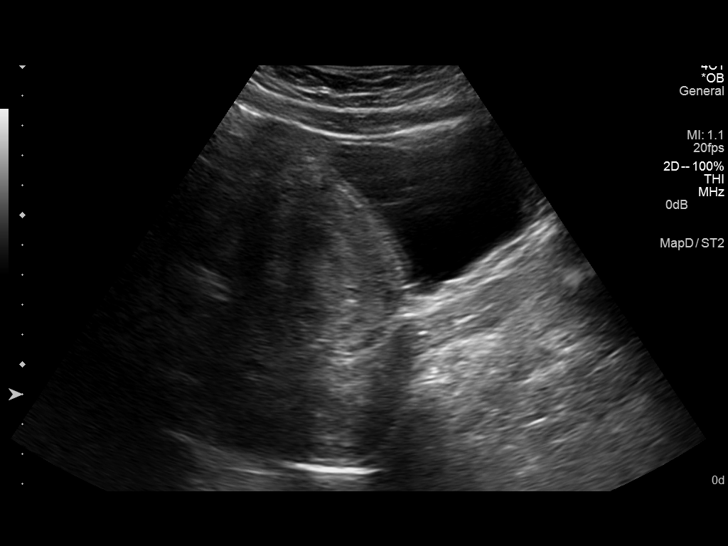
[im 12/46]
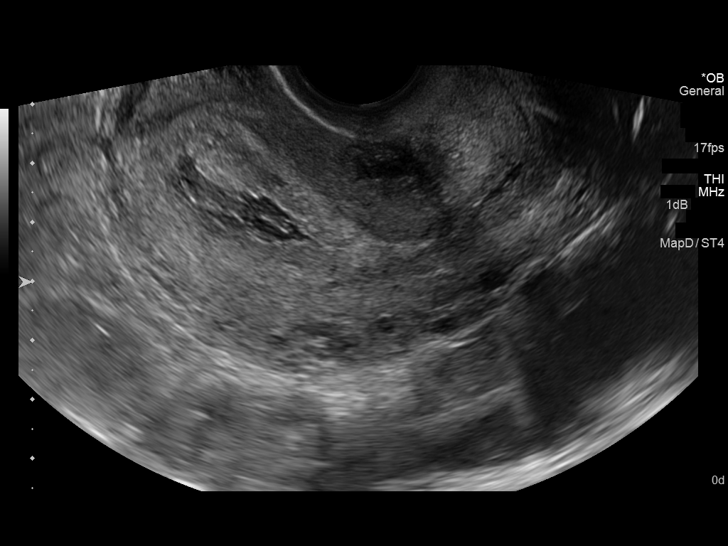
[im 16/46]
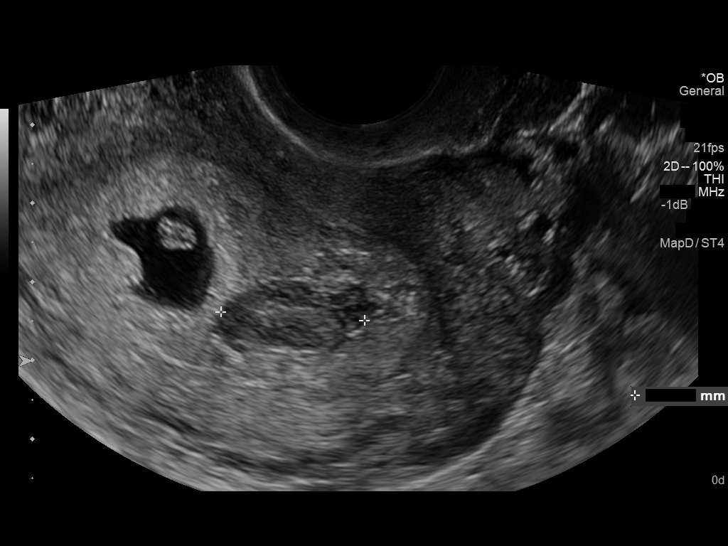
[im 19/46]
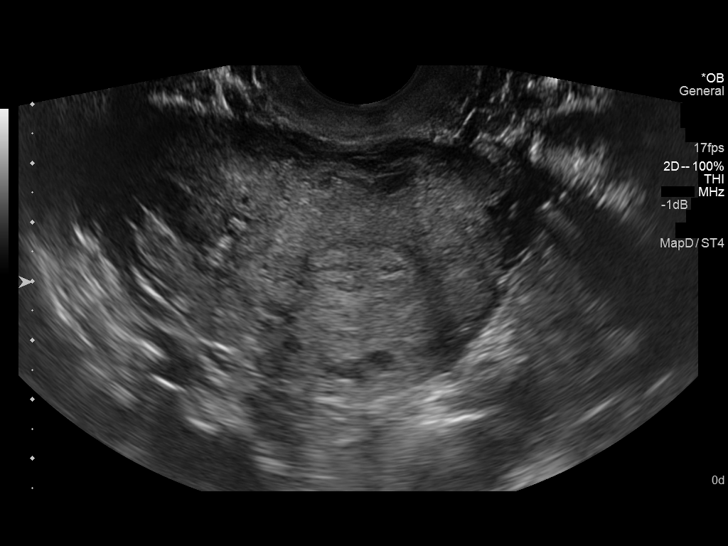
[im 22/46]
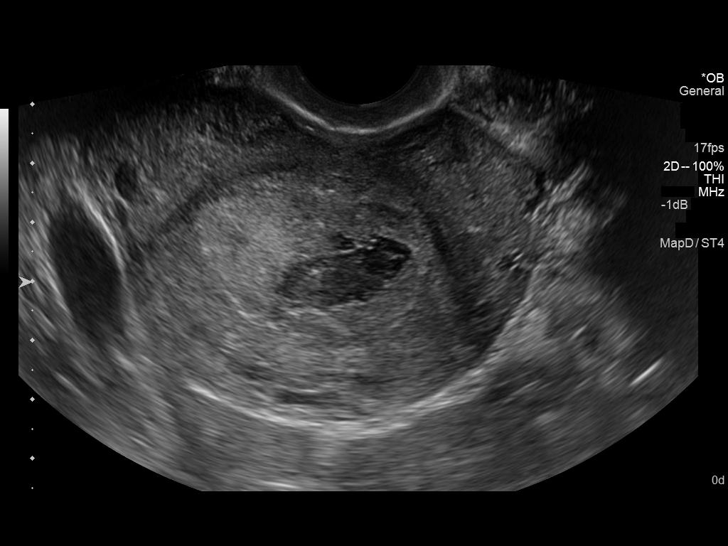
[im 26/46]
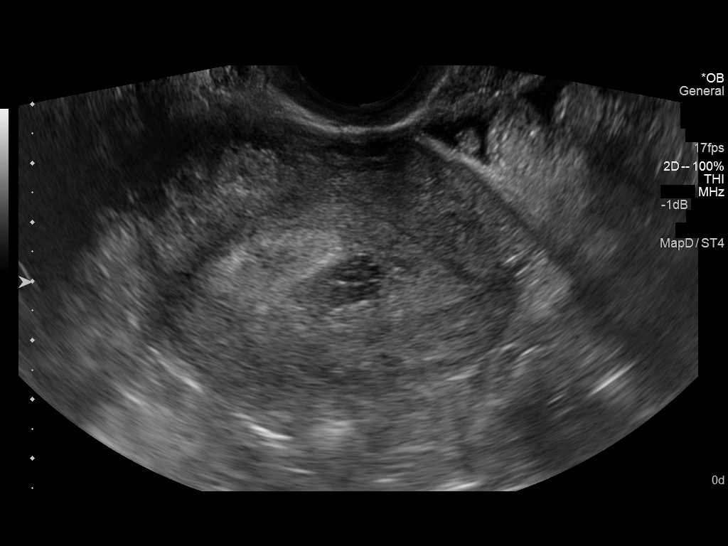
[im 29/46]
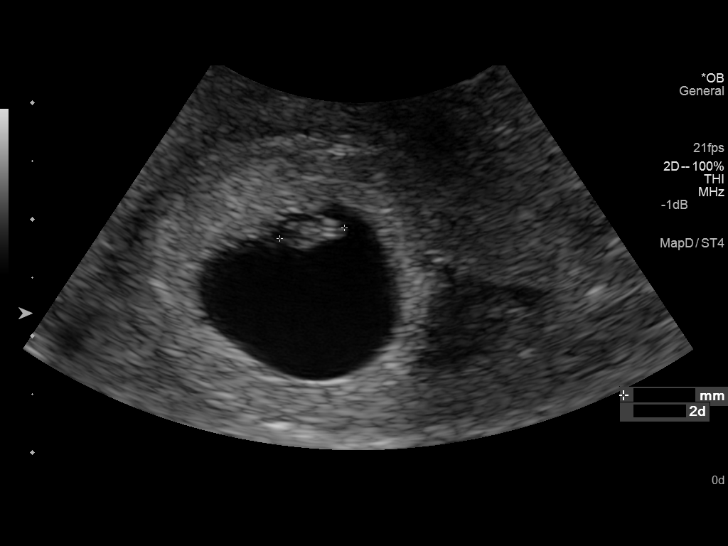
[im 32/46]
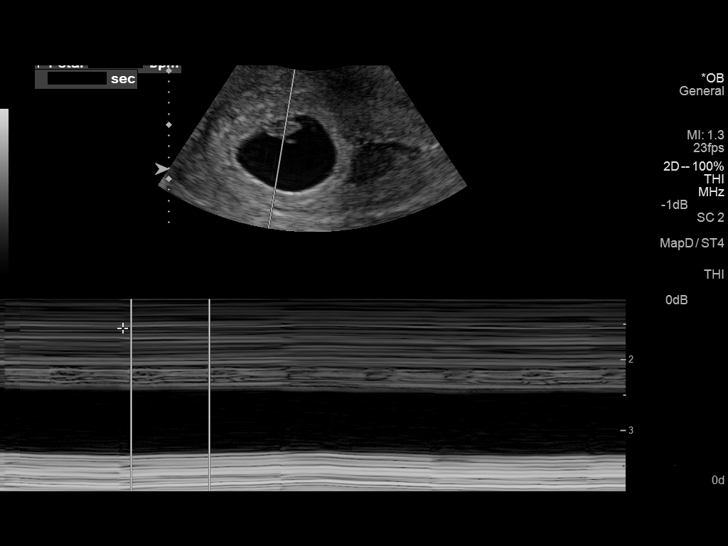
[im 36/46]
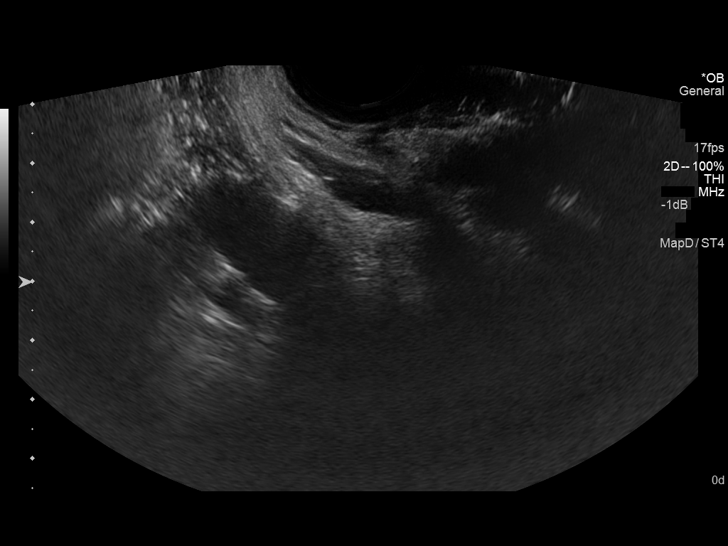
[im 39/46]
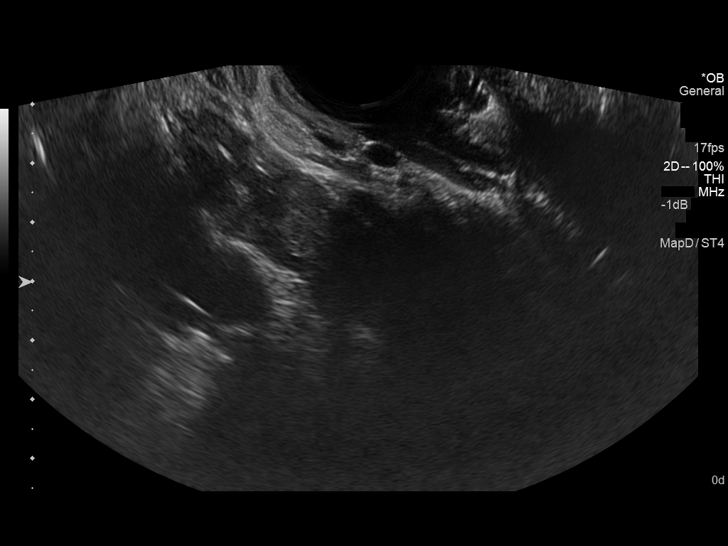
[im 42/46]
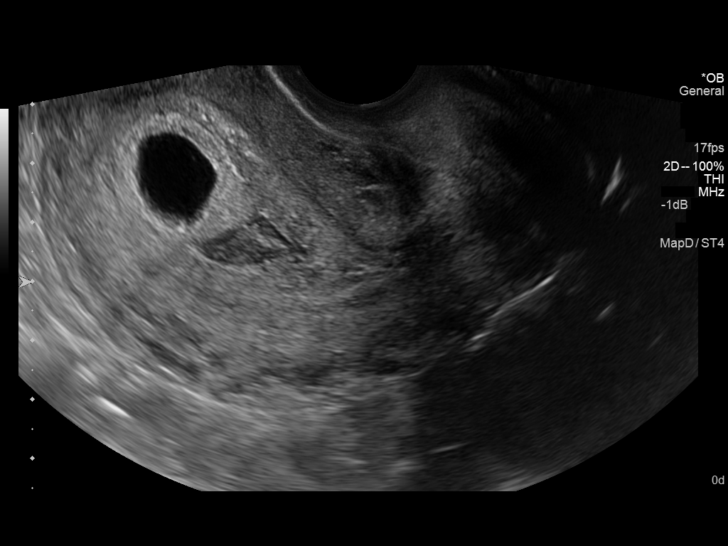
[im 46/46]
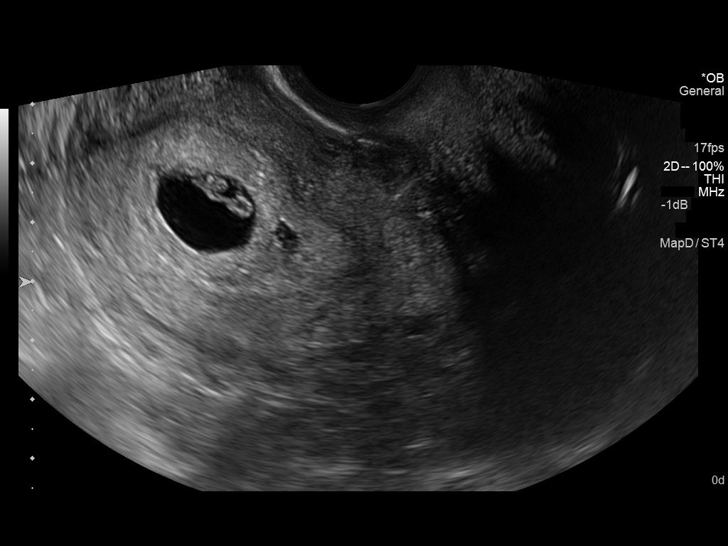

[14 of 28 positions shown; findings below may reference images not displayed]

FINDINGS: Intrauterine gestational sac: Visualized/normal in shape.

Yolk sac:  Present

Embryo:  Present

Cardiac Activity: Present

Heart Rate:  131 bpm

CRL:   5.8  mm   6 w 3 d                  US EDC: 05/23/2015

Maternal uterus/adnexae: Small subchorionic hemorrhage.

Bilateral ovaries are not discretely visualized.

No free fluid.
IMPRESSION: Single live intrauterine gestation with estimated gestational age 6
weeks 3 days by crown-rump length.

## 2015-10-01 ENCOUNTER — Encounter (HOSPITAL_COMMUNITY): Payer: Self-pay | Admitting: *Deleted

## 2015-10-01 ENCOUNTER — Emergency Department (HOSPITAL_COMMUNITY): Payer: BLUE CROSS/BLUE SHIELD

## 2015-10-01 ENCOUNTER — Emergency Department (HOSPITAL_COMMUNITY)
Admission: EM | Admit: 2015-10-01 | Discharge: 2015-10-01 | Disposition: A | Discharge status: Home / Self Care | Payer: BLUE CROSS/BLUE SHIELD | Attending: Emergency Medicine | Admitting: Emergency Medicine

## 2015-10-01 DIAGNOSIS — R42 Dizziness and giddiness: Secondary | ICD-10-CM | POA: Insufficient documentation

## 2015-10-01 DIAGNOSIS — R030 Elevated blood-pressure reading, without diagnosis of hypertension: Secondary | ICD-10-CM | POA: Insufficient documentation

## 2015-10-01 DIAGNOSIS — IMO0001 Reserved for inherently not codable concepts without codable children: Secondary | ICD-10-CM

## 2015-10-01 DIAGNOSIS — R112 Nausea with vomiting, unspecified: Secondary | ICD-10-CM | POA: Insufficient documentation

## 2015-10-01 DIAGNOSIS — Z3202 Encounter for pregnancy test, result negative: Secondary | ICD-10-CM | POA: Insufficient documentation

## 2015-10-01 DIAGNOSIS — R0600 Dyspnea, unspecified: Secondary | ICD-10-CM

## 2015-10-01 LAB — COMPREHENSIVE METABOLIC PANEL
ALT: 15 U/L (ref 14–54)
AST: 19 U/L (ref 15–41)
Albumin: 3.9 g/dL (ref 3.5–5.0)
Alkaline Phosphatase: 42 U/L (ref 38–126)
Anion gap: 7 (ref 5–15)
BUN: 11 mg/dL (ref 6–20)
CHLORIDE: 105 mmol/L (ref 101–111)
CO2: 27 mmol/L (ref 22–32)
Calcium: 9.6 mg/dL (ref 8.9–10.3)
Creatinine, Ser: 0.84 mg/dL (ref 0.44–1.00)
GFR calc Af Amer: >60 mL/min (ref >60)
GFR calc non Af Amer: >60 mL/min (ref >60)
Glucose, Bld: 93 mg/dL (ref 65–99)
Potassium: 4.1 mmol/L (ref 3.5–5.1)
Sodium: 139 mmol/L (ref 135–145)
Total Bilirubin: 1 mg/dL (ref 0.3–1.2)
Total Protein: 6.6 g/dL (ref 6.5–8.1)

## 2015-10-01 LAB — CBC WITH DIFFERENTIAL/PLATELET
BASOS ABS: 0.1 10*3/uL (ref 0.0–0.1)
BASOS PCT: 1 %
Eosinophils Absolute: 0.3 10*3/uL (ref 0.0–0.7)
Eosinophils Relative: 4 %
HCT: 38.8 % (ref 36.0–46.0)
Hemoglobin: 12.5 g/dL (ref 12.0–15.0)
Lymphocytes Relative: 40 %
Lymphs Abs: 3.2 10*3/uL (ref 0.7–4.0)
MCH: 28.8 pg (ref 26.0–34.0)
MCHC: 32.2 g/dL (ref 30.0–36.0)
MCV: 89.4 fL (ref 78.0–100.0)
MONO ABS: 0.4 10*3/uL (ref 0.1–1.0)
Monocytes Relative: 5 %
Neutro Abs: 4 10*3/uL (ref 1.7–7.7)
Neutrophils Relative %: 50 %
Platelets: 225 10*3/uL (ref 150–400)
RBC: 4.34 MIL/uL (ref 3.87–5.11)
RDW: 14.2 % (ref 11.5–15.5)
WBC: 7.9 10*3/uL (ref 4.0–10.5)

## 2015-10-01 LAB — PREGNANCY, URINE: Preg Test, Ur: NEGATIVE

## 2015-10-01 MED ORDER — ONDANSETRON 4 MG PO TBDP
4.0000 mg | ORAL_TABLET | Freq: Once | ORAL | Status: AC
  Administered 2015-10-01: 4 mg via ORAL
  Filled 2015-10-01: qty 1

## 2015-10-01 NOTE — Discharge Instructions (Signed)
It was my pleasure taking care of you today!  It is very important to take your blood pressure medication.   Please follow up with your primary doctor in 2-3 days for discussion of your diagnoses and further evaluation after today's visit; if you do not have a primary care doctor use the resource guide provided to find one; Please return to the ER for any new or worsening symptoms, any additional concerns.    Emergency Department Resource Guide 1) Find a Doctor and Pay Out of Pocket Although you won't have to find out who is covered by your insurance plan, it is a good idea to ask around and get recommendations. You will then need to call the office and see if the doctor you have chosen will accept you as a new patient and what types of options they offer for patients who are self-pay. Some doctors offer discounts or will set up payment plans for their patients who do not have insurance, but you will need to ask so you aren't surprised when you get to your appointment.  2) Contact Your Local Health Department Not all health departments have doctors that can see patients for sick visits, but many do, so it is worth a call to see if yours does. If you don't know where your local health department is, you can check in your phone book. The CDC also has a tool to help you locate your state's health department, and many state websites also have listings of all of their local health departments.  3) Find a Walk-in Clinic If your illness is not likely to be very severe or complicated, you may want to try a walk in clinic. These are popping up all over the country in pharmacies, drugstores, and shopping centers. They're usually staffed by nurse practitioners or physician assistants that have been trained to treat common illnesses and complaints. They're usually fairly quick and inexpensive. However, if you have serious medical issues or chronic medical problems, these are probably not your best option.  No  Primary Care Doctor: - Call Health Connect at  [phone] - they can help you locate a primary care doctor that  accepts your insurance, provides certain services, etc. - Physician Referral Service- [phone]  Chronic Pain Problems: Organization         Address  Phone   Notes  [facility]  [phone] Patients need to be referred by their primary care doctor.   Medication Assistance: Organization         Address  Phone   Notes  [facility] Medication [facility] [address] [place] [place], [place] [address] [phone] --Must be a resident of [facility] -- Must have NO insurance coverage whatsoever (no Medicaid/ Medicare, etc.) -- The pt. MUST have a primary care doctor that directs their care regularly and follows them in the community   [facility]ist  [phone]   [employer]  [phone]    Agencies that provide inexpensive medical care: Organization         Address  Phone   Notes  [facility]  [phone]   [facility]    [phone]   [facility] [address] [place], [place] [address] [phone]   [facility] [address] [place], [place] [phone]   Planned Parenthood    [phone]   [facility]    [phone]  [facility]  [address], [place] Phone:  [phone], Fax:  [phone] Hours of Operation:  9 am - 6 pm, M-F.  Also accepts Medicaid/Medicare and self-pay.  [facility]  [address], [place] Phone: [phone]) [phone], Fax: [phone]) [phone]. Hours of Operation:  8:30 am - 5:30 pm, M-F.  Also accepts Medicaid and self-pay.  [facility] [place] [address], [place] Phone: [phone]) [phone]   [facility] [address], [place], [place] [phone], Ext. [phone] Mondays & Thursdays: 7-9 AM.  First 15 patients are seen on  a first come, first serve basis.    Medicaid-accepting [facility] Providers:  Organization         Address  Phone   Notes  [facility] [address], [place] [phone] Also accepts self-pay patients.  [facility] [address], [place]  [phone]) [phone]   [facility] [address], [place] [phone]) [phone]   [facility] [address] [place] [address], [place] [phone]) [phone]   Renaye RakersVeita Bland [address], [place]   [phone] Only accepts [place] Access [place] patients after they have their name applied to their card.   Self-Pay (no insurance) in [facility] [place]:  Organization         Address  Phone   Notes  Sickle Cell Patients, [facility] [address], [place] [phone]   [facility] [address], [place] [phone]) [phone]   [facility]  [address], [place] [phone]) [phone]   [facility]/Dr. Osei-Bonsu  [address] [place] [address], [place] or [phone], [place] [phone](336) [phone] Phone number for both [place] and [place] locations is the same.  [facility] [address], [place] [phone]) [phone]   [facility] [place] [address] [place] [address], [place] or [address] [phone]   [facility] [address], [place] [phone](336) [phone], phone; [phone]) [phone], fax Sees patients 1st and 3rd Saturday of every month.  Must not qualify for public or private insurance (i.e. Medicaid, Medicare, [place] Health Choice, Veterans' Benefits)  Household income should be no more than 200% of the poverty level The clinic cannot treat you if you are pregnant or think you are pregnant  Sexually transmitted diseases are not treated at the clinic.    Dental Care: Organization          Address  Phone  Notes  [facility] of [facility] [address], [place] [phone]) [phone] Accepts children up to age 23 who are enrolled in [place]icaid or [place] Health Choice; pregnant women with a Medicaid card; and children who have applied for Medicaid or [place] Health Choice, but were declined, whose parents can pay a reduced fee at time of service.  [facility] of [facility] [place]  [address], [place] [phone]) [phone] Accepts children up to age 23 who are enrolled in [place]icaid or [place] Health Choice; pregnant women with a Medicaid card; and children who have applied for Medicaid or [place] Health Choice, but were declined, whose parents can pay a reduced fee at time of service.  [facility]  [phone]  [address], [place] [phone]) [phone] Patients are seen by appointment only. Walk-ins are not accepted. [facility] will see patients 23 years of age and older. Monday - Tuesday (8am-5pm) Most Wednesdays (8:30-5pm) $30 per visit, cash only  [facility]  [address], [place] [phone] Patients are seen by appointment only. Walk-ins are not accepted. [facility] will see patients 23 years of age and older. One Wednesday Evening (Monthly: Volunteer Based).  $30 per visit, cash only  [facility]  [phone] for adults; Children under age 154, call Graduate Pediatric Dentistry at [phone]. Children aged 464-14, please call [phone]) [phone] to request a pediatric application.  Dental services are provided in all areas of dental care including fillings, crowns and bridges, complete and partial dentures, implants, gum treatment, root canals, and extractions. Preventive care is also provided. Treatment is provided to both adults and children. Patients are selected via a lottery and there is often a waiting list.   [facility] [address], [place]  [phone] [email or web address]   [facility] [address], [place], [place] [phone], Ext. [phone] Second and Fourth Thursday of each month, opens at 6:30 AM; Clinic ends at 9 AM.  Patients are seen on a first-come first-served basis, and a limited number are seen during each clinic.   [facility]  [address], [place], [place] [phone]) [phone]   Eligibility Requirements You must have lived in [place], [place], or [place] counties for at least the last three months.   You cannot be eligible for state or federal sponsored National Cityhealthcare insurance, including CIGNAVeterans Administration, [place]icaid, or [employer]Medicare.   You generally cannot be eligible for healthcare insurance through your employer.    How to apply: Eligibility screenings are held every Tuesday and Wednesday afternoon from 1:00 pm until 4:00 pm. You do not need an appointment for the interview!  [facility] [address], [place], [place] [phone]   [facility]  [phone]   [facility]  [phone]   [facility]  [phone]    Behavioral Health Resources in the Community: Intensive Outpatient Programs Organization         Address  Phone  Notes  [facility] [address], [place], [place] [phone]   [facility] [address], [place], [place] [phone]   [facility] [address], [place], [place]  [phone]   [facility] [address],  [place], [place] [phone] or [phone]   Substance Abuse Resources Organization         Address  Phone  Notes  [facility]  [phone]   [facility]  [phone]   [facility]  [phone]   [facility]  [phone]   Residential & Outpatient Substance Abuse Program  [phone]   Psychological  Services Organization         Address  Phone  Notes  [facility]  [phone]- [phone]   [facility]  [phone]- [phone]   [facility] [address], [place] [phone] or [phone]    Mobile Crisis Teams Organization         Address  Phone  Notes  [facility], Mobile Crisis Care Unit  [phone]   [facility]  [address] [place], [place] [phone]  [facility] [address] [place] [phone]    Self-Help/Support Groups Organization         Address  Phone             Notes  [facility]ental [facility]oc[facility] of [place] - variety of support groups  [phone]- [phone] Call for more information  Narcotics Anonymous (NA), [facility] [address], [employer] [place]  2 meetings at this location   Statistician         Address  Phone  Notes  [facility] [address],    [place]  [phone]   [facility]  [address] [place] [address], [place], [place] [phone]   [facility] [address] [place], [place] [phone] Admissions: 8am-3pm M-F  [facility] [address]    [place], [place] [phone]   [facility] [address] [place], [place], [place] [phone]   [facility] [address]  [place], [place] [phone]   [facility] - Intensive Outpatient [address], [place], [place] [phone]   [facility] [address] [place].,  [place], [place] [phone] or [phone]   [facility] ([facility]) [address] [place], [place] [phone] Accepts Medicaid  [facility] [place] [address],  [place] [phone] Substance Abuse/Addiction Treatment   [facility] Organization         Address  Phone  Notes  [facility]  [phone]   Angie Fava, PhD [address] [place], [place]   [phone] or [phone]   [facility]   [address] [place], [place] [phone]   [facility] [address], [place], [place] [phone] Insurance/Medicaid/sponsorship through [facility] and Families [address]                                    [place], [place] [phone] Therapy/tele-psych/case  [facility] [address], [place] [phone]    Dr. Lolly Mustache  [phone]   [facility] [place]  [employer] [facility] Dept. 1) [address] [place] 2) [address] [place] 3)  [address], [place] [phone]  [phone]   [facility] Child Abuse Hotline [phone] or [phone] (After Hours)

## 2015-10-01 NOTE — ED Notes (Signed)
Pt reports sob x 1 week. Denies cough, swelling or hx of asthma. No resp distress noted at triage.

## 2015-10-01 NOTE — ED Provider Notes (Signed)
CSN: [id number]     Arrival date & time 10/01/15  1534 History   First MD Initiated Contact with Patient 10/01/15 1616     Chief Complaint  Patient presents with  . Shortness of Breath     (Consider location/radiation/quality/duration/timing/severity/associated sxs/prior Treatment) The history is provided by the patient and medical records. No language interpreter was used.   Jean Hogan is a 24 y.o. female  with no significant PMH who presents to the Emergency Department complaining of intermittent dyspnea for 1 week. Aggravated by exertion. Alleviated with rest. Associated symptoms include nausea and one episode of emesis. Denies chest pain. Pt. States her BP was well-controlled, so she discontinued her medications ~ 1 month ago. At present BP is 147/106.  History reviewed. No pertinent past medical history. History reviewed. No pertinent past surgical history. History reviewed. No pertinent family history. Social History  Substance Use Topics  . Smoking status: Never Smoker   . Smokeless tobacco: None  . Alcohol Use: Yes   OB History    Gravida Para Term Preterm AB TAB SAB Ectopic Multiple Living   1              Review of Systems  Constitutional: Negative.   HENT: Negative for congestion, rhinorrhea and sore throat.   Eyes: Negative for visual disturbance.  Respiratory: Positive for shortness of breath. Negative for cough, wheezing and stridor.   Cardiovascular: Negative.   Gastrointestinal: Positive for nausea and vomiting. Negative for abdominal pain, diarrhea and constipation.  Genitourinary: Negative for vaginal bleeding and vaginal discharge.  Musculoskeletal: Negative for myalgias, back pain, arthralgias and neck pain.  Skin: Negative for rash.  Neurological: Positive for dizziness. Negative for weakness and headaches.      Allergies  Review of patient's allergies indicates no known allergies.  Home Medications   Prior to Admission medications   Not on  File   BP 147/106 mmHg  Pulse 61  Temp(Src) 98.5 F (36.9 C) (Oral)  Resp 18  Ht 5\' 4"  (1.626 m)  Wt 96.117 kg  BMI 36.35 kg/m2  SpO2 100%  LMP   Breastfeeding? Unknown Physical Exam  Constitutional: She is oriented to person, place, and time. She appears well-developed and well-nourished.  Alert and in no acute distress  HENT:  Head: Normocephalic and atraumatic.  Neck: Normal range of motion. Neck supple. No JVD present.  Cardiovascular: Normal rate, regular rhythm, normal heart sounds and intact distal pulses.  Exam reveals no gallop and no friction rub.   No murmur heard. Pulmonary/Chest: Effort normal and breath sounds normal. No respiratory distress. She has no wheezes. She has no rales. She exhibits no tenderness.  Abdominal: She exhibits no mass. There is no rebound and no guarding.  Abdomen soft, non-tender, non-distended Bowel sounds positive in all four quadrants  Musculoskeletal: She exhibits no edema.  Lymphadenopathy:    She has no cervical adenopathy.  Neurological: She is alert and oriented to person, place, and time.  Skin: Skin is warm and dry. No rash noted.  Psychiatric: She has a normal mood and affect. Her behavior is normal. Judgment and thought content normal.  Nursing note and vitals reviewed.   ED Course  Procedures (including critical care time) Labs Review Labs Reviewed  PREGNANCY, URINE  CBC WITH DIFFERENTIAL/PLATELET  COMPREHENSIVE METABOLIC PANEL    Imaging Review Dg Chest 2 View  10/01/2015  CLINICAL DATA:  Shortness of breath for 1 week. EXAM: CHEST  2 VIEW COMPARISON:  CT of the chest  02/03/2014 FINDINGS: Cardiomediastinal silhouette is upper limits of normal. Mediastinal contours appear intact. There is no evidence of focal airspace consolidation, pleural effusion or pneumothorax. Osseous structures are without acute abnormality. Soft tissues are grossly normal. IMPRESSION: Cardiac silhouette upper limits of normal. Otherwise no evidence  of cardiopulmonary abnormality. Electronically Signed   By: Ted Mcalpine M.D.   On: 10/01/2015 16:57   I have personally reviewed and evaluated these images and lab results as part of my medical decision-making.   EKG Interpretation None      MDM   Final diagnoses:  Elevated blood pressure  Dyspnea   [facility] presents with dyspnea on exertion, associated sxs include nausea and one episode of emesis.  Denies chest pain. HEART score of 0, PERC negative. CXR with no acute cardiopulm dz. Labs reviewed and all wdl. EKG reassuring.   Blood pressure 147/106, pulse 61, temperature 98.5 F (36.9 C), resp. rate 18, SpO2 100 %  I explained the diagnosis and have given precautions as to when/if to return to the ER including for any other new or worsening symptoms. The patient understands and accepts the medical plan as it's been dictated and I have answered their questions. Discharge instructions concerning home care have been given. The patient is stable and is discharged to home in good condition. I have stressed importance of PCP follow-up and BP control.   Patient discussed with Dr. Effie Shy who agrees with treatment plan.     [facility], PA-C 10/01/15 1917  Mancel Bale, MD 10/02/15 [phone]

## 2015-10-01 NOTE — ED Notes (Signed)
Pt returned from X-ray.  

## 2016-08-23 ENCOUNTER — Emergency Department (HOSPITAL_COMMUNITY)
Admission: EM | Admit: 2016-08-23 | Discharge: 2016-08-23 | Disposition: A | Discharge status: Home / Self Care | Payer: BLUE CROSS/BLUE SHIELD | Attending: Emergency Medicine | Admitting: Emergency Medicine

## 2016-08-23 ENCOUNTER — Encounter (HOSPITAL_COMMUNITY): Payer: Self-pay | Admitting: *Deleted

## 2016-08-23 DIAGNOSIS — R1084 Generalized abdominal pain: Secondary | ICD-10-CM | POA: Insufficient documentation

## 2016-08-23 DIAGNOSIS — R112 Nausea with vomiting, unspecified: Secondary | ICD-10-CM | POA: Insufficient documentation

## 2016-08-23 DIAGNOSIS — R109 Unspecified abdominal pain: Secondary | ICD-10-CM

## 2016-08-23 DIAGNOSIS — F17200 Nicotine dependence, unspecified, uncomplicated: Secondary | ICD-10-CM | POA: Diagnosis not present

## 2016-08-23 DIAGNOSIS — R197 Diarrhea, unspecified: Secondary | ICD-10-CM | POA: Diagnosis not present

## 2016-08-23 HISTORY — DX: Essential (primary) hypertension: I10

## 2016-08-23 LAB — URINALYSIS, ROUTINE W REFLEX MICROSCOPIC
BILIRUBIN URINE: NEGATIVE
GLUCOSE, UA: NEGATIVE mg/dL
Ketones, ur: NEGATIVE mg/dL
Nitrite: NEGATIVE
Protein, ur: NEGATIVE mg/dL
SPECIFIC GRAVITY, URINE: 1.024 (ref 1.005–1.030)
pH: 7.5 (ref 5.0–8.0)

## 2016-08-23 LAB — I-STAT BETA HCG BLOOD, ED (MC, WL, AP ONLY)

## 2016-08-23 LAB — COMPREHENSIVE METABOLIC PANEL
ALBUMIN: 3.9 g/dL (ref 3.5–5.0)
ALK PHOS: 41 U/L (ref 38–126)
ALT: 17 U/L (ref 14–54)
AST: 18 U/L (ref 15–41)
Anion gap: 6 (ref 5–15)
BUN: 11 mg/dL (ref 6–20)
CALCIUM: 9.3 mg/dL (ref 8.9–10.3)
CO2: 26 mmol/L (ref 22–32)
CREATININE: 0.93 mg/dL (ref 0.44–1.00)
Chloride: 107 mmol/L (ref 101–111)
GFR calc Af Amer: >60 mL/min (ref >60)
GFR calc non Af Amer: >60 mL/min (ref >60)
Glucose, Bld: 92 mg/dL (ref 65–99)
Potassium: 4 mmol/L (ref 3.5–5.1)
SODIUM: 139 mmol/L (ref 135–145)
Total Bilirubin: 0.6 mg/dL (ref 0.3–1.2)
Total Protein: 6.2 g/dL — ABNORMAL LOW (ref 6.5–8.1)

## 2016-08-23 LAB — CBC
HCT: 39 % (ref 36.0–46.0)
Hemoglobin: 12.9 g/dL (ref 12.0–15.0)
MCH: 29.6 pg (ref 26.0–34.0)
MCHC: 33.1 g/dL (ref 30.0–36.0)
MCV: 89.4 fL (ref 78.0–100.0)
Platelets: 224 10*3/uL (ref 150–400)
RBC: 4.36 MIL/uL (ref 3.87–5.11)
RDW: 15.1 % (ref 11.5–15.5)
WBC: 8.4 10*3/uL (ref 4.0–10.5)

## 2016-08-23 LAB — URINE MICROSCOPIC-ADD ON

## 2016-08-23 LAB — LIPASE, BLOOD: Lipase: 32 U/L (ref 11–51)

## 2016-08-23 MED ORDER — DICYCLOMINE HCL 10 MG PO CAPS
10.0000 mg | ORAL_CAPSULE | Freq: Once | ORAL | Status: AC
  Administered 2016-08-23: 10 mg via ORAL
  Filled 2016-08-23: qty 1

## 2016-08-23 MED ORDER — ONDANSETRON 4 MG PO TBDP
8.0000 mg | ORAL_TABLET | Freq: Once | ORAL | Status: AC
  Administered 2016-08-23: 8 mg via ORAL
  Filled 2016-08-23: qty 2

## 2016-08-23 MED ORDER — DICYCLOMINE HCL 20 MG PO TABS: 20.0000 mg | ORAL_TABLET | Freq: Two times a day (BID) | ORAL | 0 refills | Status: DC

## 2016-08-23 MED ORDER — ONDANSETRON HCL 4 MG PO TABS: 4.0000 mg | ORAL_TABLET | Freq: Four times a day (QID) | ORAL | 0 refills | Status: DC

## 2016-08-23 NOTE — ED Notes (Signed)
Pt able to tolerate fluids 

## 2016-08-23 NOTE — ED Provider Notes (Signed)
[facility]EM[facility]Y DEPT Provider Note   CSN: [id number] Arrival date & time: 08/23/16  1351     History   Chief Complaint Chief Complaint  Patient presents with  . Diarrhea    HPI Jean Hogan is a 25 y.o. female who presents with abdominal cramping with nausea, vomiting, and diarrhea. No significant past medical history. The cramping started 4-5 days ago. Cramping is generalized and intermittent. It is worse after eating, better after having diarrhea. Diarrhea is nonbloody. She has also had some urinary frequency for the past week. Denies sick contacts. Denies previous abdominal surgeries. Denies fever, chills, flank pain, dysuria, vaginal discharge or bleeding.  HPI  Past Medical History:  Diagnosis Date  . Hypertension     There are no active problems to display for this patient.   History reviewed. No pertinent surgical history.  OB History    Gravida Para Term Preterm AB Living   1             SAB TAB Ectopic Multiple Live Births                   Home Medications    Prior to Admission medications   Not on File    Family History No family history on file.  Social History Social History  Substance Use Topics  . Smoking status: Current Some Day Smoker  . Smokeless tobacco: Never Used     Comment: Black and Milds  . Alcohol use 1.8 oz/week    3 Cans of beer per week     Allergies   Review of patient's allergies indicates no known allergies.   Review of Systems Review of Systems  Constitutional: Negative for chills and fever.  Gastrointestinal: Positive for abdominal pain, diarrhea, nausea and vomiting. Negative for blood in stool and constipation.  Genitourinary: Positive for frequency. Negative for dysuria, flank pain, hematuria, pelvic pain, vaginal bleeding and vaginal discharge.  All other systems reviewed and are negative.    Physical Exam Updated Vital Signs BP 129/72 (BP Location: Left Arm)   Pulse 66   Temp 98.1 F (36.7 C)  (Oral)   Resp 18   Ht 5\' 4"  (1.626 m)   Wt 97.5 kg   LMP 07/18/2016   SpO2 100%   BMI 36.90 kg/m   Physical Exam  Constitutional: She is oriented to person, place, and time. She appears well-developed and well-nourished. No distress.  Calm, cooperative, well-appearing  HENT:  Head: Normocephalic and atraumatic.  Eyes: Conjunctivae are normal. Pupils are equal, round, and reactive to light. Right eye exhibits no discharge. Left eye exhibits no discharge. No scleral icterus.  Neck: Normal range of motion. Neck supple.  Cardiovascular: Normal rate and regular rhythm.  Exam reveals no gallop and no friction rub.   No murmur heard. Pulmonary/Chest: Effort normal and breath sounds normal. No respiratory distress. She has no wheezes. She has no rales. She exhibits no tenderness.  Abdominal: Soft. Bowel sounds are normal. She exhibits no distension and no mass. There is no tenderness. There is no rebound and no guarding. No hernia.  Soft, non-tender abdomen. Patient states cramping not made worse by touch  Musculoskeletal: She exhibits no edema.  Neurological: She is alert and oriented to person, place, and time.  Skin: Skin is warm and dry.  Psychiatric: She has a normal mood and affect. Her behavior is normal.  Nursing note and vitals reviewed.    ED Treatments / Results  Labs (all labs ordered are listed,  but only abnormal results are displayed) Labs Reviewed  COMPREHENSIVE METABOLIC PANEL - Abnormal; Notable for the following:       Result Value   Total Protein 6.2 (*)    All other components within normal limits  URINALYSIS, ROUTINE W REFLEX MICROSCOPIC (NOT [facility]) - Abnormal; Notable for the following:    APPearance HAZY (*)    Hgb urine dipstick TRACE (*)    Leukocytes, UA SMALL (*)    All other components within normal limits  URINE MICROSCOPIC-ADD ON - Abnormal; Notable for the following:    Squamous Epithelial / LPF 6-30 (*)    Bacteria, UA FEW (*)    All other  components within normal limits  URINE CULTURE  LIPASE, BLOOD  CBC  I-STAT BETA HCG BLOOD, ED ([facility], [facility], [facility] ONLY)    EKG  EKG Interpretation None       Radiology No results found.  Procedures Procedures (including critical care time)  Medications Ordered in ED Medications  ondansetron (ZOFRAN-ODT) disintegrating tablet 8 mg (8 mg Oral Given 08/23/16 1759)  dicyclomine (BENTYL) capsule 10 mg (10 mg Oral Given 08/23/16 1759)     Initial Impression / Assessment and Plan / ED Course  I have reviewed the triage vital signs and the nursing notes.  Pertinent labs & imaging results that were available during my care of the patient were reviewed by me and considered in my medical decision making (see chart for details).  Clinical Course   25 year old female presents with symptoms consistent with viral illness with possible UTI. Patient is afebrile, not tachycardic or tachypneic, normotensive, and not hypoxic. Abdomen is soft, nontender. Labs are unremarkable. Preg test negative. UA remarkable for few bacteria, trace hgb, trace, small leukocytes, 6-30 WBC, but appears contaminated. She is having some frequency. Will send culture and not treat at this time due to diarrhea and cramping. Zofran and Bentyl given. PO challenge tolerated. Patient is NAD, non-toxic, with stable VS. Patient is informed of clinical course, understands medical decision making process, and agrees with plan. Opportunity for questions provided and all questions answered. Return precautions given.   Final Clinical Impressions(s) / ED Diagnoses   Final diagnoses:  Nausea vomiting and diarrhea  Abdominal cramping    New Prescriptions New Prescriptions   DICYCLOMINE (BENTYL) 20 MG TABLET    Take 1 tablet (20 mg total) by mouth 2 (two) times daily.   ONDANSETRON (ZOFRAN) 4 MG TABLET    Take 1 tablet (4 mg total) by mouth every 6 (six) hours.     Bethel BornKelly Marie Gekas, PA-C 08/23/16 1843    Linwood DibblesJon Knapp, MD 08/25/16  [phone]

## 2016-08-23 NOTE — ED Triage Notes (Signed)
Pt states loose stool and abd cramping x 1 week.  Today diarrhea x 3.

## 2016-08-25 LAB — URINE CULTURE

## 2017-01-12 ENCOUNTER — Emergency Department (HOSPITAL_COMMUNITY): Payer: Self-pay

## 2017-01-12 ENCOUNTER — Encounter (HOSPITAL_COMMUNITY): Payer: Self-pay | Admitting: Emergency Medicine

## 2017-01-12 ENCOUNTER — Emergency Department (HOSPITAL_COMMUNITY)
Admission: EM | Admit: 2017-01-12 | Discharge: 2017-01-12 | Disposition: A | Discharge status: Home / Self Care | Payer: Self-pay | Attending: Emergency Medicine | Admitting: Emergency Medicine

## 2017-01-12 DIAGNOSIS — N8003 Adenomyosis of the uterus: Secondary | ICD-10-CM

## 2017-01-12 DIAGNOSIS — N8000 Endometriosis of the uterus, unspecified: Secondary | ICD-10-CM

## 2017-01-12 DIAGNOSIS — R110 Nausea: Secondary | ICD-10-CM

## 2017-01-12 DIAGNOSIS — R102 Pelvic and perineal pain unspecified side: Secondary | ICD-10-CM

## 2017-01-12 DIAGNOSIS — R1021 Pelvic and perineal pain right side: Secondary | ICD-10-CM

## 2017-01-12 DIAGNOSIS — N921 Excessive and frequent menstruation with irregular cycle: Secondary | ICD-10-CM

## 2017-01-12 DIAGNOSIS — N938 Other specified abnormal uterine and vaginal bleeding: Secondary | ICD-10-CM

## 2017-01-12 DIAGNOSIS — I10 Essential (primary) hypertension: Secondary | ICD-10-CM | POA: Insufficient documentation

## 2017-01-12 DIAGNOSIS — F17200 Nicotine dependence, unspecified, uncomplicated: Secondary | ICD-10-CM | POA: Insufficient documentation

## 2017-01-12 LAB — URINALYSIS, ROUTINE W REFLEX MICROSCOPIC
Bilirubin Urine: NEGATIVE
GLUCOSE, UA: NEGATIVE mg/dL
Ketones, ur: NEGATIVE mg/dL
Nitrite: NEGATIVE
PH: 6 (ref 5.0–8.0)
Protein, ur: NEGATIVE mg/dL
SPECIFIC GRAVITY, URINE: 1.03 (ref 1.005–1.030)

## 2017-01-12 LAB — CBC WITH DIFFERENTIAL/PLATELET
Basophils Absolute: 0 10*3/uL (ref 0.0–0.1)
Basophils Relative: 0 %
Eosinophils Absolute: 0.3 10*3/uL (ref 0.0–0.7)
Eosinophils Relative: 3 %
HEMATOCRIT: 42.7 % (ref 36.0–46.0)
HEMOGLOBIN: 14.6 g/dL (ref 12.0–15.0)
LYMPHS PCT: 40 %
Lymphs Abs: 4.1 10*3/uL — ABNORMAL HIGH (ref 0.7–4.0)
MCH: 31.4 pg (ref 26.0–34.0)
MCHC: 34.2 g/dL (ref 30.0–36.0)
MCV: 91.8 fL (ref 78.0–100.0)
MONO ABS: 0.5 10*3/uL (ref 0.1–1.0)
MONOS PCT: 5 %
NEUTROS ABS: 5.5 10*3/uL (ref 1.7–7.7)
NEUTROS PCT: 52 %
Platelets: 252 10*3/uL (ref 150–400)
RBC: 4.65 MIL/uL (ref 3.87–5.11)
RDW: 13.8 % (ref 11.5–15.5)
WBC: 10.3 10*3/uL (ref 4.0–10.5)

## 2017-01-12 LAB — LIPASE, BLOOD: Lipase: 16 U/L (ref 11–51)

## 2017-01-12 LAB — WET PREP, GENITAL
SPERM: NONE SEEN
Trich, Wet Prep: NONE SEEN
Yeast Wet Prep HPF POC: NONE SEEN

## 2017-01-12 LAB — COMPREHENSIVE METABOLIC PANEL
ALK PHOS: 56 U/L (ref 38–126)
ALT: 13 U/L — AB (ref 14–54)
AST: 23 U/L (ref 15–41)
Albumin: 4.3 g/dL (ref 3.5–5.0)
Anion gap: 9 (ref 5–15)
BILIRUBIN TOTAL: 0.9 mg/dL (ref 0.3–1.2)
BUN: 12 mg/dL (ref 6–20)
CALCIUM: 9.3 mg/dL (ref 8.9–10.3)
CO2: 24 mmol/L (ref 22–32)
CREATININE: 1.03 mg/dL — AB (ref 0.44–1.00)
Chloride: 105 mmol/L (ref 101–111)
GFR calc Af Amer: >60 mL/min (ref >60)
Glucose, Bld: 141 mg/dL — ABNORMAL HIGH (ref 65–99)
POTASSIUM: 3.5 mmol/L (ref 3.5–5.1)
Sodium: 138 mmol/L (ref 135–145)
TOTAL PROTEIN: 7.1 g/dL (ref 6.5–8.1)

## 2017-01-12 LAB — HIV ANTIBODY (ROUTINE TESTING W REFLEX): HIV Screen 4th Generation wRfx: NONREACTIVE

## 2017-01-12 LAB — PREGNANCY, URINE: Preg Test, Ur: NEGATIVE

## 2017-01-12 LAB — RPR: RPR Ser Ql: NONREACTIVE

## 2017-01-12 MED ORDER — ONDANSETRON 4 MG PO TBDP: 4.0000 mg | ORAL_TABLET | Freq: Three times a day (TID) | ORAL | 0 refills | Status: AC | PRN

## 2017-01-12 MED ORDER — ONDANSETRON 4 MG PO TBDP: 4.0000 mg | ORAL_TABLET | Freq: Three times a day (TID) | ORAL | 0 refills | Status: DC | PRN

## 2017-01-12 MED ORDER — MEFENAMIC ACID 250 MG PO CAPS
500.0000 mg | ORAL_CAPSULE | Freq: Three times a day (TID) | ORAL | 0 refills | Status: DC
Start: 2017-01-12 — End: 2017-01-12

## 2017-01-12 MED ORDER — IBUPROFEN 800 MG PO TABS
800.0000 mg | ORAL_TABLET | Freq: Once | ORAL | Status: AC
  Administered 2017-01-12: 800 mg via ORAL
  Filled 2017-01-12: qty 1

## 2017-01-12 MED ORDER — MEFENAMIC ACID 250 MG PO CAPS: 500.0000 mg | ORAL_CAPSULE | Freq: Three times a day (TID) | ORAL | 0 refills | Status: AC

## 2017-01-12 NOTE — ED Triage Notes (Addendum)
Pt c/o  6/10 lower abd pain, having some nausea, denies fever chills, vomiting or diarrhea. Pt states she still bleeding since 12/07/2016, pt consulted PCP about the bleeding, per PCP possible ovarian cyst, appointment made for April for US. But pt starting having worsen pain today.

## 2017-01-12 NOTE — ED Provider Notes (Signed)
[facility] Provider Note   CSN: [id number] Arrival date & time: 01/12/17  0409     History   Chief Complaint Chief Complaint  Patient presents with  . Abdominal Pain    HPI Jean Hogan is a 26 y.o. G65P1101 female with a PMHx of HTN, and recent pre-term delivery of stillborn infant in August 2017, who presents to the ED with complaints of sudden onset lower abdominal pain that began approximately 30 minutes prior to arrival, which she describes as a 6/10 intermittent crampy nonradiating RLQ and LLQ abd pain, worse in the right side, with no known aggravating or alleviating factors given that she has not tried anything prior to arrival. Associated symptoms include mild nausea. She states that she's been having vaginal spotting since her LMP on 12/07/16, states that it's very light, mostly dark blood, spotting on 2-3 pads per day without saturating any pads, denies any clots or tissue passage. She states that she has had irregular menses since August when she had a preterm stillborn delivery. She went to her PCP on 01/07/17 who wondered whether she had an ovarian cyst and therefore scheduled her for an ultrasound in April to evaluate the irregular menses and vaginal bleeding. However she did not have pain at that time, the pain only started tonight. She is sexually active with one female, unprotected. She denies any recent travel, sick contacts, suspicious food intake, alcohol use, NSAID use, or prior abdominal surgeries. She also denies any fevers, chills, CP, SOB, V/D/C, obstipation, melena, hematochezia, dysuria, hematuria, vaginal discharge, genital sores, myalgias, arthralgias, numbness, tingling, focal weakness, or any other complaints at this time. No hx of similar symptoms.    The history is provided by the patient and medical records. No language interpreter was used.  Abdominal Pain   This is a new problem. The current episode started less than 1 hour ago. Episode frequency:  intermittent. The problem has not changed since onset.The pain is associated with an unknown factor. The pain is located in the LLQ and RLQ. The quality of the pain is cramping. The pain is at a severity of 6/10. The pain is mild. Associated symptoms include nausea. Pertinent negatives include fever, diarrhea, flatus, hematochezia, melena, vomiting, constipation, dysuria, hematuria, arthralgias and myalgias. Nothing aggravates the symptoms. Nothing relieves the symptoms.    Past Medical History:  Diagnosis Date  . Hypertension     There are no active problems to display for this patient.   History reviewed. No pertinent surgical history.  OB History    Gravida Para Term Preterm AB Living   1             SAB TAB Ectopic Multiple Live Births                   Home Medications    Prior to Admission medications   Medication Sig Start Date End Date Taking? Authorizing Provider  dicyclomine (BENTYL) 20 MG tablet Take 1 tablet (20 mg total) by mouth 2 (two) times daily. 08/23/16   Bethel Born, PA-C  ondansetron (ZOFRAN) 4 MG tablet Take 1 tablet (4 mg total) by mouth every 6 (six) hours. 08/23/16   Bethel Born, PA-C    Family History History reviewed. No pertinent family history.  Social History Social History  Substance Use Topics  . Smoking status: Current Some Day Smoker  . Smokeless tobacco: Never Used     Comment: Black and Milds  . Alcohol use 1.8 oz/week  3 Cans of beer per week     Allergies   Patient has no known allergies.   Review of Systems Review of Systems  Constitutional: Negative for chills and fever.  Respiratory: Negative for shortness of breath.   Cardiovascular: Negative for chest pain.  Gastrointestinal: Positive for abdominal pain and nausea. Negative for blood in stool, constipation, diarrhea, flatus, hematochezia, melena and vomiting.  Genitourinary: Positive for menstrual problem and vaginal bleeding (spotting since 12/07/16).  Negative for dysuria, genital sores, hematuria and vaginal discharge.  Musculoskeletal: Negative for arthralgias and myalgias.  Skin: Negative for color change.  Allergic/Immunologic: Negative for immunocompromised state.  Neurological: Negative for weakness and numbness.  Psychiatric/Behavioral: Negative for confusion.   10 Systems reviewed and are negative for acute change except as noted in the HPI.   Physical Exam Updated Vital Signs BP (!) 159/116 (BP Location: Right Arm)   Pulse 84   Temp 97.8 F (36.6 C) (Oral)   Resp 18   Ht 5\' 4"  (1.626 m)   Wt 104.3 kg   LMP 12/07/2016   SpO2 98%   BMI 39.48 kg/m   Physical Exam  Constitutional: She is oriented to person, place, and time. Vital signs are normal. She appears well-developed and well-nourished.  Non-toxic appearance. No distress.  Afebrile, nontoxic, NAD  HENT:  Head: Normocephalic and atraumatic.  Mouth/Throat: Oropharynx is clear and moist and mucous membranes are normal.  Eyes: Conjunctivae and EOM are normal. Right eye exhibits no discharge. Left eye exhibits no discharge.  Neck: Normal range of motion. Neck supple.  Cardiovascular: Normal rate, regular rhythm, normal heart sounds and intact distal pulses.  Exam reveals no gallop and no friction rub.   No murmur heard. Pulmonary/Chest: Effort normal and breath sounds normal. No respiratory distress. She has no decreased breath sounds. She has no wheezes. She has no rhonchi. She has no rales.  Abdominal: Soft. Normal appearance and bowel sounds are normal. She exhibits no distension. There is tenderness in the right lower quadrant and left lower quadrant. There is no rigidity, no rebound, no guarding, no CVA tenderness, no tenderness at McBurney's point and negative Murphy's sign.  Soft, obese but nondistended, +BS throughout, with mild RLQ and LLQ TTP along pelvic brim, no r/g/r, neg murphy's, neg mcburney's, no CVA TTP, neg foot tap test, neg psoas sign  Genitourinary:  Pelvic exam was performed with patient supine. There is no rash, tenderness or lesion on the right labia. There is no rash, tenderness or lesion on the left labia. Uterus is tender. Uterus is not deviated, not enlarged and not fixed. Cervix exhibits no motion tenderness, no discharge and no friability. Right adnexum displays tenderness. Right adnexum displays no mass and no fullness. Left adnexum displays no mass, no tenderness and no fullness. There is bleeding in the vagina. No erythema or tenderness in the vagina. No vaginal discharge found.  Genitourinary Comments: Chaperone present for exam. No rashes, lesions, or tenderness to external genitalia. No erythema, injury, or tenderness to vaginal mucosa. No vaginal discharge, mild amount of dark vaginal blood within vaginal vault, coming from cervix. No adnexal masses or fullness, but +R adnexal tenderness, no L adnexal tenderness. No CMT, cervical friability, or discharge from cervical os. Cervical os is closed. Uterus non-deviated, mobile, and without enlargement, however there is some mild tenderness to palpation, but not very significant.    Musculoskeletal: Normal range of motion.  Neurological: She is alert and oriented to person, place, and time. She has normal strength.  No sensory deficit.  Skin: Skin is warm, dry and intact. No rash noted.  Psychiatric: She has a normal mood and affect.  Nursing note and vitals reviewed.    ED Treatments / Results  Labs (all labs ordered are listed, but only abnormal results are displayed) Labs Reviewed  WET PREP, GENITAL - Abnormal; Notable for the following:       Result Value   Clue Cells Wet Prep HPF POC PRESENT (*)    WBC, Wet Prep HPF POC MANY (*)    All other components within normal limits  COMPREHENSIVE METABOLIC PANEL - Abnormal; Notable for the following:    Glucose, Bld 141 (*)    Creatinine, Ser 1.03 (*)    ALT 13 (*)    All other components within normal limits  URINALYSIS, ROUTINE W  REFLEX MICROSCOPIC - Abnormal; Notable for the following:    APPearance HAZY (*)    Hgb urine dipstick LARGE (*)    Leukocytes, UA SMALL (*)    Bacteria, UA RARE (*)    Squamous Epithelial / LPF 6-30 (*)    All other components within normal limits  CBC WITH DIFFERENTIAL/PLATELET - Abnormal; Notable for the following:    Lymphs Abs 4.1 (*)    All other components within normal limits  LIPASE, BLOOD  PREGNANCY, URINE  RPR  HIV ANTIBODY (ROUTINE TESTING)  GC/CHLAMYDIA PROBE AMP ([place]) NOT AT [facility]    EKG  EKG Interpretation None       Radiology US Transvaginal Non-ob  Result Date: 01/12/2017 CLINICAL DATA:  Right adnexal pain EXAM: TRANSABDOMINAL AND TRANSVAGINAL ULTRASOUND OF PELVIS DOPPLER ULTRASOUND OF OVARIES TECHNIQUE: Both transabdominal and transvaginal ultrasound examinations of the pelvis were performed. Transabdominal technique was performed for global imaging of the pelvis including uterus, ovaries, adnexal regions, and pelvic cul-de-sac. It was necessary to proceed with endovaginal exam following the transabdominal exam to visualize the endometrium. Color and duplex Doppler ultrasound was utilized to evaluate blood flow to the ovaries. COMPARISON:  Pelvic ultrasound 09/30/2014. FINDINGS: Uterus Measurements: 8.8 x 4.4 x 7.6 cm. There is prominent peripheral uterine vascularity. No fibroids or other mass visualized. Endometrium Thickness:  4.1 mm.  No focal abnormality visualized. Right ovary Measurements: 5.7 x 2.4 x 4.3 cm . Normal appearance/no adnexal mass. Multiple peripheral follicles. Left ovary Measurements:  4.3 x 2.3 x 4.8 cm.  Multiple peripheral follicles. Pulsed Doppler evaluation of both ovaries demonstrates normal low-resistance arterial and venous waveforms. Other findings No abnormal free fluid. IMPRESSION: 1. No ovarian torsion. 2. No hemorrhagic cyst or other adnexal mass. 3. Prominent peripheral uterine vascularity, somewhat nonspecific, but may be seen  in the setting of adenomyosis. Electronically Signed   By: Deatra Robinson M.D.   On: 01/12/2017 06:45   US Pelvis Complete  Result Date: 01/12/2017 CLINICAL DATA:  Right adnexal pain EXAM: TRANSABDOMINAL AND TRANSVAGINAL ULTRASOUND OF PELVIS DOPPLER ULTRASOUND OF OVARIES TECHNIQUE: Both transabdominal and transvaginal ultrasound examinations of the pelvis were performed. Transabdominal technique was performed for global imaging of the pelvis including uterus, ovaries, adnexal regions, and pelvic cul-de-sac. It was necessary to proceed with endovaginal exam following the transabdominal exam to visualize the endometrium. Color and duplex Doppler ultrasound was utilized to evaluate blood flow to the ovaries. COMPARISON:  Pelvic ultrasound 09/30/2014. FINDINGS: Uterus Measurements: 8.8 x 4.4 x 7.6 cm. There is prominent peripheral uterine vascularity. No fibroids or other mass visualized. Endometrium Thickness:  4.1 mm.  No focal abnormality visualized. Right ovary Measurements: 5.7 x  2.4 x 4.3 cm . Normal appearance/no adnexal mass. Multiple peripheral follicles. Left ovary Measurements:  4.3 x 2.3 x 4.8 cm.  Multiple peripheral follicles. Pulsed Doppler evaluation of both ovaries demonstrates normal low-resistance arterial and venous waveforms. Other findings No abnormal free fluid. IMPRESSION: 1. No ovarian torsion. 2. No hemorrhagic cyst or other adnexal mass. 3. Prominent peripheral uterine vascularity, somewhat nonspecific, but may be seen in the setting of adenomyosis. Electronically Signed   By: Deatra RobinsonKevin  Herman M.D.   On: 01/12/2017 06:45   [place] Art/ven Flow Abd Pelv Doppler  Result Date: 01/12/2017 CLINICAL DATA:  Right adnexal pain EXAM: TRANSABDOMINAL AND TRANSVAGINAL ULTRASOUND OF PELVIS DOPPLER ULTRASOUND OF OVARIES TECHNIQUE: Both transabdominal and transvaginal ultrasound examinations of the pelvis were performed. Transabdominal technique was performed for global imaging of the pelvis including uterus,  ovaries, adnexal regions, and pelvic cul-de-sac. It was necessary to proceed with endovaginal exam following the transabdominal exam to visualize the endometrium. Color and duplex Doppler ultrasound was utilized to evaluate blood flow to the ovaries. COMPARISON:  Pelvic ultrasound 09/30/2014. FINDINGS: Uterus Measurements: 8.8 x 4.4 x 7.6 cm. There is prominent peripheral uterine vascularity. No fibroids or other mass visualized. Endometrium Thickness:  4.1 mm.  No focal abnormality visualized. Right ovary Measurements: 5.7 x 2.4 x 4.3 cm . Normal appearance/no adnexal mass. Multiple peripheral follicles. Left ovary Measurements:  4.3 x 2.3 x 4.8 cm.  Multiple peripheral follicles. Pulsed Doppler evaluation of both ovaries demonstrates normal low-resistance arterial and venous waveforms. Other findings No abnormal free fluid. IMPRESSION: 1. No ovarian torsion. 2. No hemorrhagic cyst or other adnexal mass. 3. Prominent peripheral uterine vascularity, somewhat nonspecific, but may be seen in the setting of adenomyosis. Electronically Signed   By: Deatra RobinsonKevin  Herman M.D.   On: 01/12/2017 06:45    Procedures Pelvic exam Date/Time: 01/12/2017 5:10 AM Performed by: Rhona RaiderSTREET, Bhavya Grand Authorized by: Rhona Raider[address]RE[address], Mercede Rollo  Consent: Verbal consent obtained. Risks and benefits: risks, benefits and alternatives were discussed Consent given by: patient Patient understanding: patient states understanding of the procedure being performed Patient consent: the patient's understanding of the procedure matches consent given Patient identity confirmed: verbally with patient Local anesthesia used: no  Anesthesia: Local anesthesia used: no  Sedation: Patient sedated: no Patient tolerance: Patient tolerated the procedure well with no immediate complications Comments: See GU section of exam for documentation of pelvic exam. Chaperone present.     (including critical care time)  Medications Ordered in ED Medications    ibuprofen (ADVIL,MOTRIN) tablet 800 mg (800 mg Oral Given 01/12/17 0651)     Initial Impression / Assessment and Plan / ED Course  I have reviewed the triage vital signs and the nursing notes.  Pertinent labs & imaging results that were available during my care of the patient were reviewed by me and considered in my medical decision making (see chart for details).     26 y.o. female here with lower abd pain R>L x30 mins with mild nausea tonight, and vaginal bleeding x1 month (spotting, irregular menses since August after a preterm delivery resulting in a stillborn). On exam, mild lower abd TTP R>L near pelvic brim, nonperitoneal, neg mcburney's. Afebrile and nontoxic. CBC w/diff unremarkable which is reassuring, doubt appendicitis; other labs pending. Will proceed with pelvic exam to further evaluate, and then decide on imaging based on pelvic exam findings. Will get STD panel as well as wet prep during pelvic exam. Pt declines wanting anything for pain or nausea; will reassess shortly  5:13 AM  CMP with mildly bumped Cr 1.03, likely insignificant; gluc 141, otherwise unremarkable. Lipase WNL. U/A and Upreg pending. Pelvic exam revealed mild R adnexal tenderness, mild dark vaginal blood in vault coming from cervix, and mild uterine tenderness but not very significant, no CMT or vaginal discharge. Doubt need for empiric GC/CT/trich treatment given exam findings not concerning for that. Will proceed with TVUS to r/o torsion vs cyst vs TOA/PID vs fibroids vs other etiology of symptoms. Pt continues to decline wanting anything for symptoms at this time. Will reassess shortly.   6:54 AM Upreg neg. U/A fairly contaminated, no evidence of infection or other concerning findings given degree of vaginal contamination. Wet prep with many WBCs and +clue cells however no vaginal d/c on exam, and pt with vaginal bleeding which is likely causing these findings; doubt need for tx of BV given lack of clinical  findings suggestive of that. U/S shows no torsion, no cysts or masses, but prominent uterine vascularity which could be seen in adenomyosis, which may explain her abnormal vaginal bleeding/menses. Pelvic pain likely related to DUB and this finding, will start on mefenamic acid for pain/bleeding, advised additional tylenol use as well; rx for zofran given; f/up with PCP in 5-7 days for recheck and ongoing management of pelvic pain and DUB. Advised regarding pending STD testing and abstinence until results return, and importance of having partners tested/treated if any abnormal results return, f/up with health dept for any future STD concerns. [facility] I explained the diagnosis and have given explicit precautions to return to the ER including for any other new or worsening symptoms. The patient understands and accepts the medical plan as it's been dictated and I have answered their questions. Discharge instructions concerning home care and prescriptions have been given. The patient is STABLE and is discharged to home in good condition.   Final Clinical Impressions(s) / ED Diagnoses   Final diagnoses:  Right adnexal tenderness  Pelvic pain in female  Nausea  DUB (dysfunctional uterine bleeding)  Metrorrhagia  Uterus, adenomyosis    New Prescriptions New Prescriptions   MEFENAMIC ACID 250 MG CAPS    Take 2 capsules (500 mg total) by mouth 3 (three) times daily. x4-5 days or until bleeding stops   ONDANSETRON (ZOFRAN ODT) 4 MG DISINTEGRATING TABLET    Take 1 tablet (4 mg total) by mouth every 8 (eight) hours as needed for nausea or vomiting.     [address], PA-C 01/12/17 6045    Gilda Crease, MD 01/12/17 [phone]

## 2017-01-12 NOTE — ED Notes (Signed)
Taken to US at this time. 

## 2017-01-12 NOTE — Discharge Instructions (Addendum)
Your work up today was reassuring. Your pelvic pain is likely related to your irregular menstrual cycles and bleeding, which may be due to an increased blood tissue within the uterus. Take mefenamic acid as directed to help with pain and bleeding. Use additional tylenol as needed for pain relief. Stay well hydrated. Use zofran as directed as needed for nausea. Follow up with your primary care doctor in 5-7 days for recheck of symptoms and ongoing management of your vaginal bleeding and pelvic pain.  [facility] (called the [facility]) for changes or worsening symptoms.   Also, you have been tested for gonorrhea, chlamydia, HIV, and Syphilis in the ER today, and the hospital will call you if the lab is positive. DO NOT ENGAGE IN SEXUAL ACTIVITY UNTIL YOU FIND OUT ABOUT YOUR RESULTS AND HAVE PARTNERS TESTED AND TREATED. ALL PARTNERS MUST BE TESTED AND TREATED FOR STD'S. ALWAYS USE CONDOMS WHEN ENGAGING IN INTERCOURSE. [facility] for future STD concerns or screenings.

## 2017-01-13 LAB — GC/CHLAMYDIA PROBE AMP (~~LOC~~) NOT AT ARMC
Chlamydia: NEGATIVE
Neisseria Gonorrhea: NEGATIVE

## 2017-12-11 IMAGING — US US TRANSVAGINAL NON-OB
1 series · 13 of 25 positions shown · non-contrast
Comparison: Pelvic ultrasound 09/30/2014.

CLINICAL DATA: Right adnexal pain

EXAM:
TRANSABDOMINAL AND TRANSVAGINAL ULTRASOUND OF PELVIS
DOPPLER ULTRASOUND OF OVARIES
TECHNIQUE: Both transabdominal and transvaginal ultrasound examinations of the
pelvis were performed. Transabdominal technique was performed for
global imaging of the pelvis including uterus, ovaries, adnexal
regions, and pelvic cul-de-sac.
It was necessary to proceed with endovaginal exam following the
transabdominal exam to visualize the endometrium. Color and duplex
Doppler ultrasound was utilized to evaluate blood flow to the
ovaries.

[Series 1: us transvaginal non-ob · 0.25mm/px · 13 of 59 slices shown]
[im 1/59]
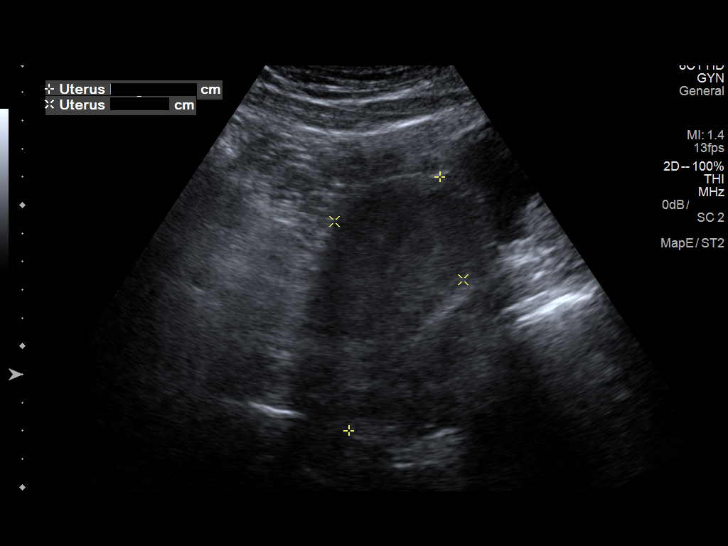
[im 5/59]
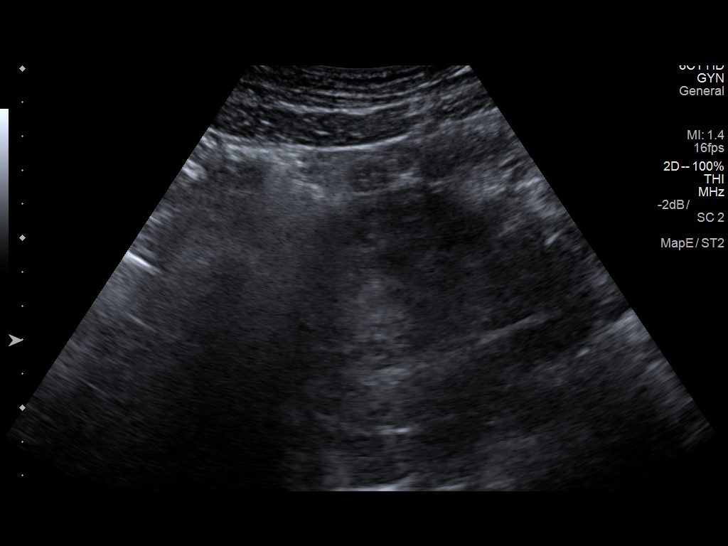
[im 10/59]
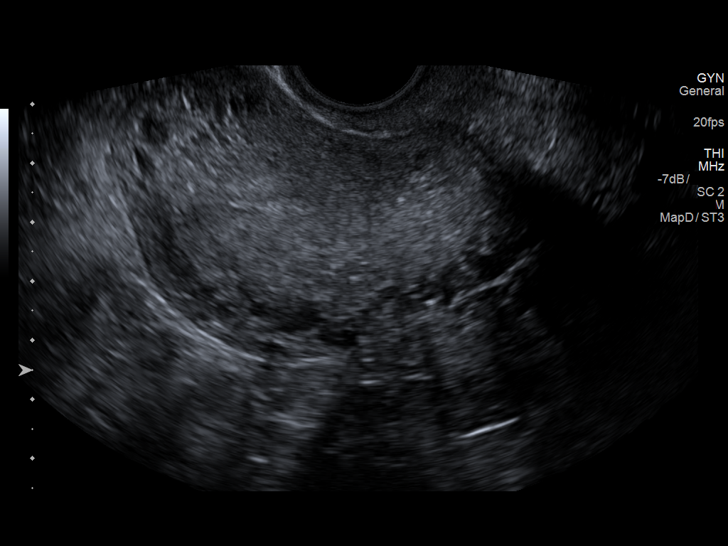
[im 15/59]
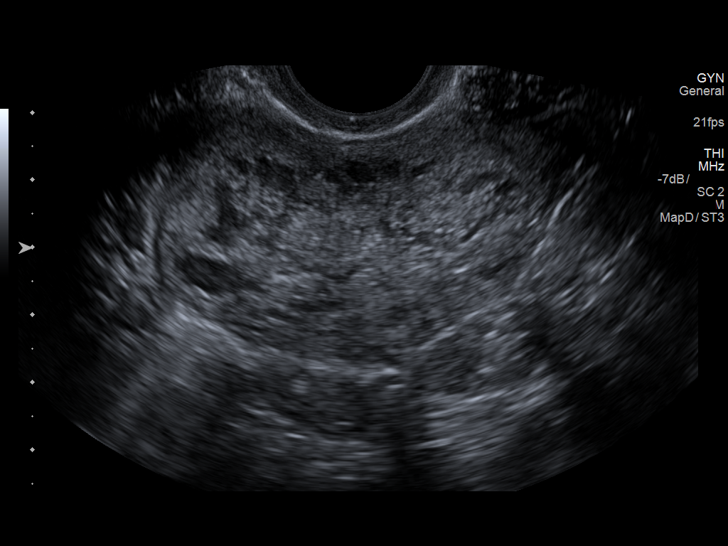
[im 20/59]
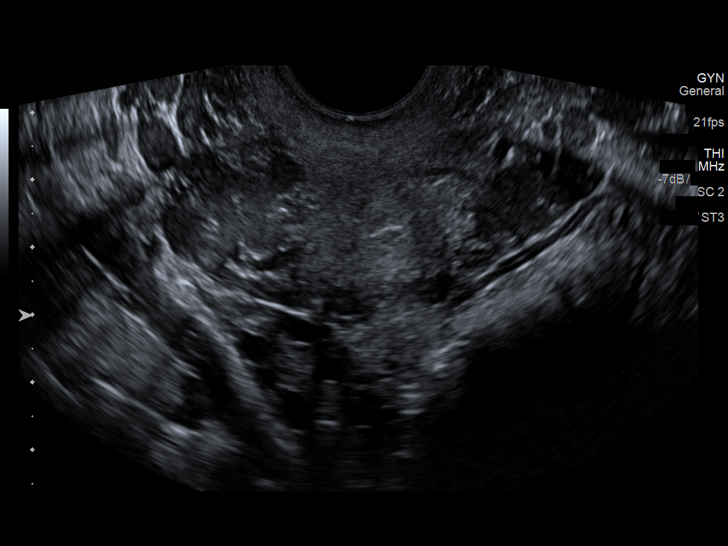
[im 25/59]
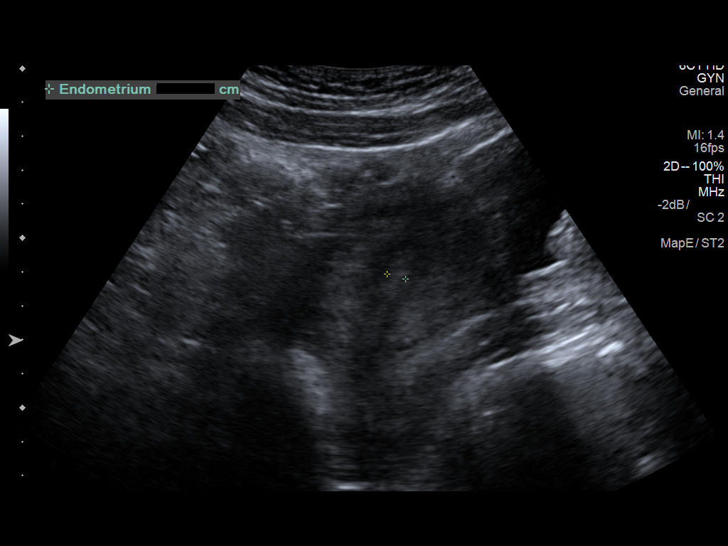
[im 30/59]
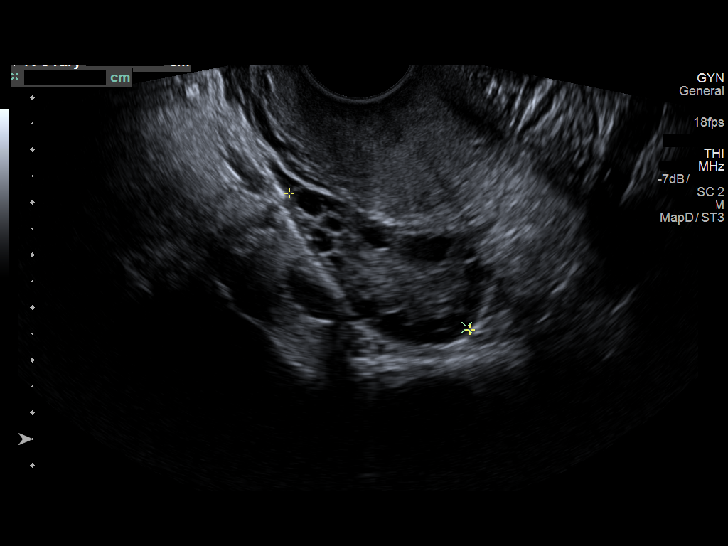
[im 34/59]
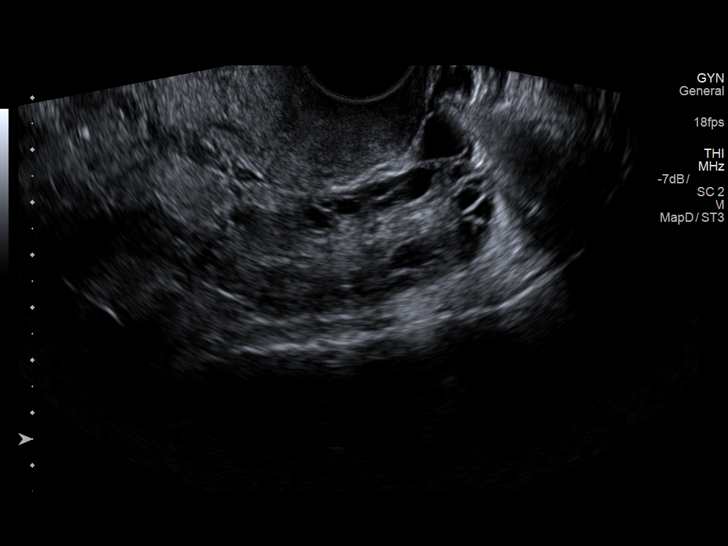
[im 39/59]
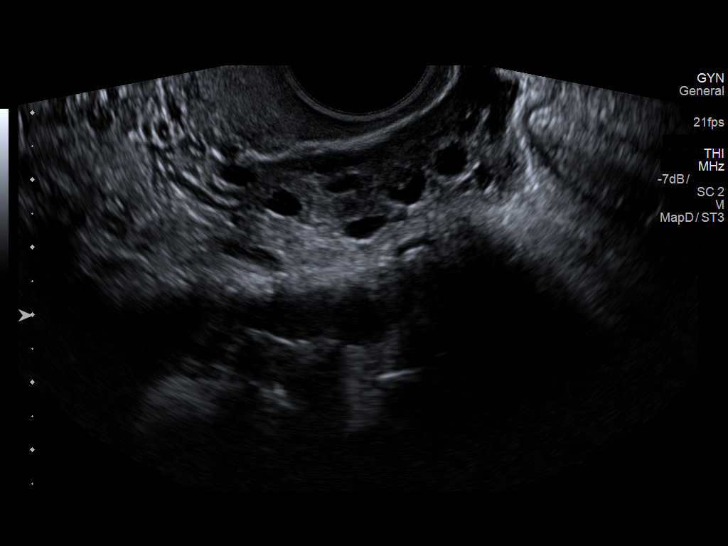
[im 44/59]
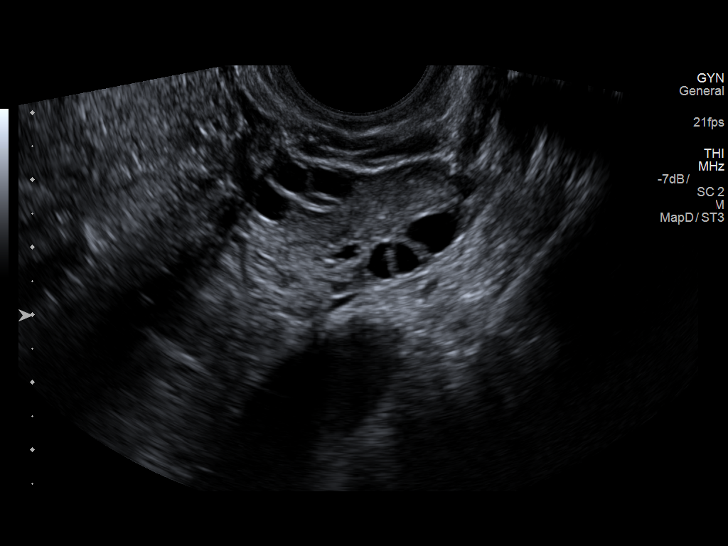
[im 49/59]
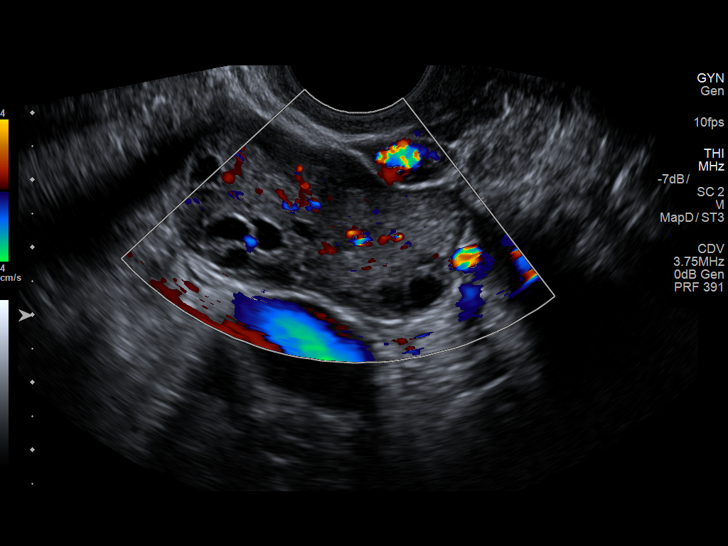
[im 54/59]
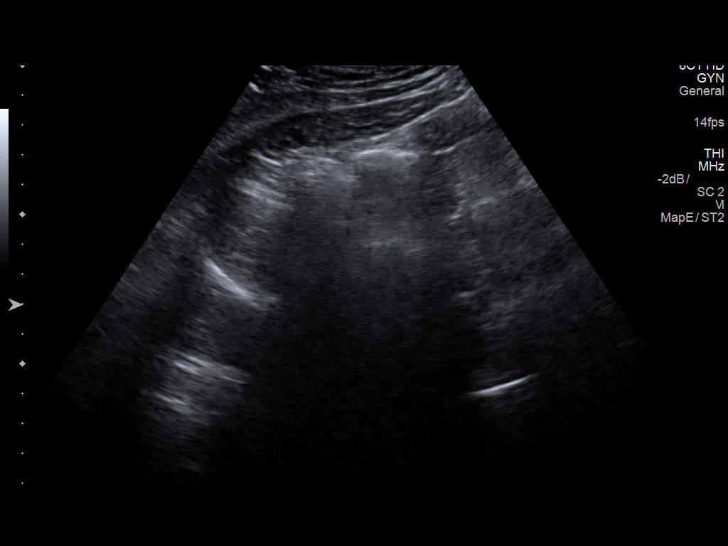
[im 59/59]
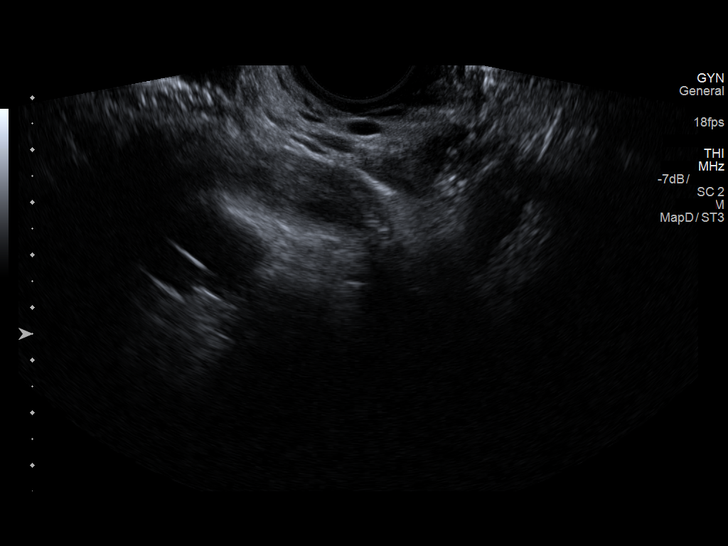

[13 of 25 positions shown; findings below may reference images not displayed]

FINDINGS: Uterus

Measurements: 8.8 x 4.4 x 7.6 cm. There is prominent peripheral
uterine vascularity. No fibroids or other mass visualized.

Endometrium

Thickness:  4.1 mm.  No focal abnormality visualized.

Right ovary

Measurements: 5.7 x 2.4 x 4.3 cm . Normal appearance/no adnexal
mass. Multiple peripheral follicles.

Left ovary

Measurements:  4.3 x 2.3 x 4.8 cm.  Multiple peripheral follicles.

Pulsed Doppler evaluation of both ovaries demonstrates normal
low-resistance arterial and venous waveforms.

Other findings

No abnormal free fluid.
IMPRESSION: 1. No ovarian torsion.
2. No hemorrhagic cyst or other adnexal mass.
3. Prominent peripheral uterine vascularity, somewhat nonspecific,
but may be seen in the setting of adenomyosis.
